# Patient Record
Sex: Female | Born: 1935 | Race: White | Hispanic: No | Marital: Married | Smoking: Former smoker
Health system: Southern US, Community
[De-identification: ages and names within clinical notes are randomized; demographics above are authoritative.]

## PROBLEM LIST (undated history)

## (undated) DIAGNOSIS — IMO0002 Reserved for concepts with insufficient information to code with codable children: Secondary | ICD-10-CM

## (undated) DIAGNOSIS — F0390 Unspecified dementia without behavioral disturbance: Secondary | ICD-10-CM

## (undated) DIAGNOSIS — R209 Unspecified disturbances of skin sensation: Secondary | ICD-10-CM

## (undated) DIAGNOSIS — M545 Low back pain, unspecified: Secondary | ICD-10-CM

## (undated) DIAGNOSIS — G20 Parkinson's disease: Secondary | ICD-10-CM

## (undated) DIAGNOSIS — E559 Vitamin D deficiency, unspecified: Secondary | ICD-10-CM

## (undated) DIAGNOSIS — M069 Rheumatoid arthritis, unspecified: Secondary | ICD-10-CM

## (undated) HISTORY — DX: Unspecified dementia without behavioral disturbance: F03.90

## (undated) HISTORY — DX: Vitamin D deficiency, unspecified: E55.9

## (undated) HISTORY — DX: Rheumatoid arthritis, unspecified: M06.9

## (undated) HISTORY — DX: Unspecified disturbances of skin sensation: R20.9

## (undated) HISTORY — DX: Low back pain, unspecified: M54.50

## (undated) HISTORY — DX: Low back pain: M54.5

## (undated) HISTORY — DX: Parkinson's disease: G20

## (undated) HISTORY — DX: Reserved for concepts with insufficient information to code with codable children: IMO0002

---

## 2004-01-30 ENCOUNTER — Ambulatory Visit: Payer: Self-pay | Admitting: Unknown Physician Specialty

## 2004-02-09 ENCOUNTER — Ambulatory Visit: Payer: Self-pay | Admitting: Unknown Physician Specialty

## 2004-08-04 ENCOUNTER — Ambulatory Visit: Payer: Self-pay | Admitting: Unknown Physician Specialty

## 2004-08-10 ENCOUNTER — Ambulatory Visit: Payer: Self-pay | Admitting: Unknown Physician Specialty

## 2005-05-10 ENCOUNTER — Ambulatory Visit: Payer: Self-pay | Admitting: Unknown Physician Specialty

## 2005-05-12 ENCOUNTER — Ambulatory Visit: Payer: Self-pay | Admitting: Unknown Physician Specialty

## 2005-08-23 ENCOUNTER — Ambulatory Visit: Payer: Self-pay | Admitting: Unknown Physician Specialty

## 2005-09-15 ENCOUNTER — Ambulatory Visit: Payer: Self-pay | Admitting: Rheumatology

## 2006-04-18 HISTORY — PX: LUMBAR SPINE SURGERY: SHX701

## 2006-05-31 ENCOUNTER — Ambulatory Visit: Payer: Self-pay | Admitting: Internal Medicine

## 2006-07-03 ENCOUNTER — Ambulatory Visit: Payer: Self-pay | Admitting: Cardiology

## 2006-07-03 ENCOUNTER — Inpatient Hospital Stay (HOSPITAL_COMMUNITY): Admission: RE | Admit: 2006-07-03 | Discharge: 2006-07-06 | Payer: Self-pay | Admitting: Neurological Surgery

## 2006-12-08 ENCOUNTER — Encounter: Payer: Self-pay | Admitting: Internal Medicine

## 2006-12-18 ENCOUNTER — Encounter: Payer: Self-pay | Admitting: Internal Medicine

## 2007-01-17 ENCOUNTER — Encounter: Payer: Self-pay | Admitting: Internal Medicine

## 2007-03-09 ENCOUNTER — Ambulatory Visit: Payer: Self-pay | Admitting: Internal Medicine

## 2007-08-02 ENCOUNTER — Ambulatory Visit: Payer: Self-pay | Admitting: Internal Medicine

## 2007-08-19 ENCOUNTER — Other Ambulatory Visit: Payer: Self-pay

## 2007-08-19 ENCOUNTER — Emergency Department: Payer: Self-pay | Admitting: Emergency Medicine

## 2008-09-09 ENCOUNTER — Ambulatory Visit: Payer: Self-pay | Admitting: Internal Medicine

## 2008-10-14 ENCOUNTER — Ambulatory Visit: Payer: Self-pay | Admitting: Internal Medicine

## 2009-04-16 ENCOUNTER — Encounter: Admission: RE | Admit: 2009-04-16 | Discharge: 2009-04-16 | Payer: Self-pay | Admitting: Neurological Surgery

## 2009-04-18 HISTORY — PX: VERTEBROPLASTY: SHX113

## 2009-04-29 ENCOUNTER — Encounter: Admission: RE | Admit: 2009-04-29 | Discharge: 2009-04-29 | Payer: Self-pay | Admitting: Neurological Surgery

## 2009-05-05 ENCOUNTER — Encounter: Admission: RE | Admit: 2009-05-05 | Discharge: 2009-05-05 | Payer: Self-pay | Admitting: Neurological Surgery

## 2009-05-18 ENCOUNTER — Encounter: Admission: RE | Admit: 2009-05-18 | Discharge: 2009-05-18 | Payer: Self-pay | Admitting: Neurological Surgery

## 2009-12-07 ENCOUNTER — Ambulatory Visit: Payer: Self-pay | Admitting: Internal Medicine

## 2010-09-03 NOTE — Op Note (Signed)
Kelly Brady, Kelly Brady            ACCOUNT NO.:  [id number]   MEDICAL RECORD NO.:  [id number]          PATIENT TYPE:  INP   LOCATION:  2899                         FACILITY:  [facility]   PHYSICIAN:  Stefani Dama, M.D.  DATE OF BIRTH:  1930-09-05   DATE OF PROCEDURE:  07/03/2006  DATE OF DISCHARGE:                               OPERATIVE REPORT   PREOPERATIVE DIAGNOSIS:  Spondylolisthesis, L4-5, with lumbar stenosis,  lumbar radiculopathy.   POSTOPERATIVE DIAGNOSIS:  Spondylolisthesis, L4-5, with lumbar stenosis,  lumbar radiculopathy.   PROCEDURES:  1. Laminotomy, L4-5.  2. Decompression of L4 and L5 nerve roots.  Posterior lumbar interbody      fusion with PEEK spacers, local autograft and allograft.  3. Fixation L4-L5 with pedicle screws.   SURGEON:  Stefani Dama, M.D.   FIRST ASSISTANT:  Hilda Lias, M.D.   ANESTHESIA:  General endotracheal.   INDICATIONS:  Kelly Brady is a 77 year old individual who has had a  spondylolisthesis at the L4-5 level with a severe stenosis.  She has  bilateral lumbar radiculopathy in addition to lower extremity pain and  weakness.  She was advised regarding surgical decompression and  arthrodesis, having failed extensive efforts at previous conservative  management.   PROCEDURE:  The patient was brought to the operating room supine on the  stretcher.  After smooth induction of general tracheal anesthesia she  was turned prone and the back was prepped with alcohol and then DuraPrep  and draped in a sterile fashion.  A midline incision was created and  carried down to lumbar dorsal fascia, which was opened on either side of  midline to expose the spinous processes of L4 and L5.  L4 was identified  positively on a radiograph and then by dissecting in the subperiosteal  spaces, the facettes at L4-5 could be exposed at the region of the  transverse process.  A self-retaining retractor was placed in the wound  and the laminotomies were  then created by removing the complete inferior  lamina of L4 out to and including the entirety of the facette at L4-5.  The laminar arch was left attached to the pars via a portion of the  medial lamina and the dissection in this fashion decompressed the common  dural tube.  Thickened redundant yellow ligament was removed in this  area from around the common dural tube and the lateral gutter out to and  including the medial borders of the facettes with compression on the L4  nerve roots.  The superior articular processes of L5 were undercut to  allow decompression of the subarticular portion of the common dural tube  and the L5 nerve root takeoffs zone.  This compression was noted to  exist bilaterally and bilateral decompression was carried out in this  fashion using high-speed drill and the Kerrison punch to perform the  decompression.  Once the nerve roots were all mobilized, the common  dural tube could be retracted medially and this exposed a significant  disk protrusion with the disk being rolled forward and the  spondylolisthesis.  The disk space was incised with a 15 blade  and a  combination of Kerrison rongeurs was used to evacuate the disk space of  a significant quantity of disk material and to also remove the  endplates.  The dissection was carried out in the disk space completely  to then decorticate the bone on the endplates.  This was done with a  combination of rasps, curettes and rongeurs and a disk space cutter.  The spaces were then sized and it was felt that a 12 mm PEEK spacer  would fit nicely to allow for some distraction, which also allowed for  reduction of the spondylolisthesis.  Two standard PEEK PLIF interbody  spacers of 12 mm in height were then packed with a combination of local  autograft, Osteocel and allograft, and these were fitted into the  interspaces after cutting and decorticating the endplates.  The right  spacer was placed first, the left spacer  placed second.  Bone was placed  into the interspace also before placing the second spacer.  Once this  portion was completed, attention was then turned to fixation and pedicle  screw placement was then placed using fluoroscopic guidance to place  pedicle screws adequately in the L4 and the L5 pedicles.  These were  first sounded with a pedicle probe and then 6.5 x 45 mm pedicle screws  were drilled and tapped and placed into the pedicles of L4 and L5.  Precontoured 35 mm rods were then connected to allow this construct to  the placed solidly between L4 and L5.  The posterolateral gutters which  were previously decorticated were then packed with the remainder of the  bone graft.  Once this was secured, the wound was copiously irrigated  with antibiotic irrigating solution and a final check revealed good  decompression of the L4 and the L5 nerve roots.  The lumbar dorsal  fascia was then reapproximated with #1 Vicryl in interrupted fashion, 2-  0 Vicryl was used on the subcutaneous tissues, 3-0 Vicryl  subcuticularly, and a dry sterile dressing was applied to the skin.  The  patient tolerated the procedure well and was returned to the recovery  room in stable condition.  Blood loss was estimated at 500 mL, 1 unit of  Cell Saver blood was returned.      Stefani Dama, M.D.  Electronically Signed     HJE/MEDQ  D:  07/03/2006  T:  07/04/2006  Job:  [id number]

## 2010-09-03 NOTE — Consult Note (Signed)
[name], Kelly Brady            ACCOUNT NO.:  [id number]   MEDICAL RECORD NO.:  [id number]          PATIENT TYPE:  INP   LOCATION:  3041                         FACILITY:  [facility]   PHYSICIAN:  Jesse Sans. Wall, MD, FACCDATE OF BIRTH:  1930-12-26   DATE OF CONSULTATION:  07/04/2006  DATE OF DISCHARGE:                                 CONSULTATION   CARDIOLOGY CONSULTATION:  We were asked by Dr. Danielle Dess to evaluate Kelly Brady with a resting  tachycardia and a history of hypertension postoperatively.   HISTORY OF PRESENT ILLNESS:  She is a very pleasant 77 year old,  married, white female who is status post L4-L5 laminectomy and  decompression fixation for spondylolisthesis and lumbar stenosis with a  radiculopathy.   She has had a history of hypertension and says she always has an  elevated heart rate when she is in the hospital or anxious.   Her primary care physician is Beverely Risen who is managing her blood  pressure on lisinopril and HCTZ.  Her blood pressure has actually been  good here.   She has a history of chronic back pain, gastroesophageal reflux and a  question of Parkinson disease.   CURRENT MEDICATIONS:  Are:  1. Alphagan drops both eyes b.i.d.  2. Calcium and vitamin q.h.s.  3. HCTZ 12.5 daily.  4. Lisinopril 20 mg a day.  5. Mobic 7.5 b.i.d.  6. Requip 3 mg t.i.d.  7. Morphine sulfate.  8. PCA.  9. Omega-3 one gram q.48 hours.  10.Effexor 37.5 mg a day.  11.Labetalol p.r.n.   SOCIAL HISTORY:  She lives in [place] with her husband.  She has 2  children.  She is retired from [employer].  She does not smoke or  drink or use illicit drugs.   FAMILY HISTORY:  Significant for her mother dying at age 40 of a stroke  and she has 1 sibling who is elderly, in her 78s, with atrial fib;  otherwise, unremarkable.   REVIEW OF SYSTEMS:  Negative fevers, chills, sweats or adenopathy.  HEENT:  No recent nasal bleeds, hearing or visual loss or dental issues.  SKIN:  Unremarkable.  CARDIOPULMONARY:  See HPI.  She does not have any  history of chest pain, shortness of breath, dyspnea on exertion, PND or  orthopnea.  She has had a little bit of lower extremity edema.  GENITOURINARY:  No frequency, urgency, dysuria or hematuria.  NEURO/PSYCHOLOGICAL:  No mood disturbance, depression or anxiety.  She  does get a little tense when she is in the hospital.  MUSCULOSKELETAL:  See HPI.  GI:  No nausea, vomiting, diarrhea, bright red blood per  rectum or melena.  She does have chronic constipation.  ENDOCRINE:  No  polyuria, dipsia, heat and cold intolerance.   EXAMINATION:  VITAL SIGNS:  Her blood pressure is 123/64.  Her pulse is  currently 92 and she is in sinus rhythm and she is regular.  Her  respiratory rate is 16, unlabored.  Temp is 99.4.  O2 saturation is 97%  on 2 liters.  GENERAL APPEARANCE:  A very pleasant lady.  She is slightly  pale.  SKIN:  Warm and dry.  HEENT:  Normocephalic, atraumatic.  PERRLA.  Extraocular movements  intact.  Sclerae are clear.  NECK:  Supple.  There is no JVD.  Carotid upstrokes are equal  bilaterally without bruits.  There is no thyromegaly.  CARDIOVASCULAR:  Regular rate and rhythm.  Normal S1, S2 without murmur,  rub or gallop.  LUNGS:  Clear to auscultation and percussion.  ABDOMINAL EXAM:  Soft.  Good bowel sounds are present.  No tenderness.  SKIN:  Unremarkable.  GENITOURINARY/RECTAL:  Deferred.  EXTREMITIES:  No cyanosis, clubbing or edema.  Pulses were present,  bilaterally symmetrical and her exam is grossly intact, except some left  lower extremity weakness.   Chest x-ray, on March 13, showed no active disease and a normal sinus  heart.  She has mild thoracic osteopenia.  Electrocardiogram, on June 22, 2006 preop, showed a heart rate of 89 beats per minute.  Normal sinus  rhythm.  Poor R-wave progression anterior precordium.  No ST-segment  changes.   LABORATORY DATA:  Hemoglobin is 11.3.   Electrolytes were normal.   ASSESSMENT/RECOMMENDATION:  1. Sinus tachycardia secondary to surgery, pain and anxiety within the      hospital.  EKG shows poor R-wave progression across the anterior      precordium, which is probably of no clinical significance.  She is      totally asymptomatic from a cardiac perspective with a negative      history of coronary artery disease.  She does have a cardiac risk      factors of age and hypertension.  2. Hypertension.  Under good control and on a good medical program.   RECOMMENDATIONS:  No cardiac evaluation is necessary at this time.  The  patient was assured that things were quite stable.  Thank you for the  consultation.      Thomas C. Daleen Squibb, MD, [facility]  Electronically Signed     TCW/MEDQ  D:  07/04/2006  T:  07/04/2006  Job:  [id number]   cc:   Stefani Dama, M.D.  Beverely Risen, M.D.

## 2010-09-03 NOTE — Discharge Summary (Signed)
NAMEKATHERINNE, [name]            ACCOUNT NO.:  [id number]   MEDICAL RECORD NO.:  [id number]          PATIENT TYPE:  INP   LOCATION:  3041                         FACILITY:  [facility]   PHYSICIAN:  Stefani Dama, M.D.  DATE OF BIRTH:  04-Apr-1931   DATE OF ADMISSION:  07/03/2006  DATE OF DISCHARGE:  07/06/2006                               DISCHARGE SUMMARY   ADMITTING DIAGNOSIS:  Lumbar spondylosis and spondylolisthesis at L4-L5  with stenosis and lumbar radiculopathy.   DISCHARGE AND FINAL DIAGNOSES:  1. Lumbar spondylosis and spondylolisthesis, L4-L5, with stenosis and      lumbar radiculopathy.  2. Acute blood loss anemia.  3. Parkinsonism.  4. Acute postoperative confusion.   CONDITION ON DISCHARGE:  Improving.   HOSPITAL COURSE:  Kelly Brady is a 77 year old individual who has  had significant spondylosis and spondylolisthesis with stenosis.  She  has evidence of lumbar radiculopathy at L5 distribution.  She was taken  to the operating room on July 03, 2006 where she underwent surgical  decompression arthrodesis at L4-L5.  Postoperatively, the patient was  noted to have some acute blood loss anemia with her hemoglobin going  from 14 to 11.  She seemed to tolerate this well with extra fluid. She  also had a diagnosis of tachycardia.  This was evaluated by [facility]iology and felt to typical postoperative and was corrected only with  IV and p.o. fluids.  She developed some confusional state during the  postoperative period on the second and third night postoperatively.  Her  bowels moved on the third day postoperatively.  Her incision has been  clean and dry, and she has become ambulatory.  She is tolerating the  postoperative pain with the use of some oral Percocet, and she is given  a prescription for #60 of these without refills.  She has also been on  Flexeril 10 mg one b.i.d. as needed for muscle spasms.  She is given a  prescription for this with p.r.n.  refills.  She will be seen in the  office in approximately three weeks' time.   CONDITION ON DISCHARGE:  Improving.      Stefani Dama, M.D.  Electronically Signed     HJE/MEDQ  D:  07/06/2006  T:  07/06/2006  Job:  [id number]

## 2010-12-09 ENCOUNTER — Ambulatory Visit: Payer: Self-pay | Admitting: Internal Medicine

## 2011-04-19 HISTORY — PX: MELANOMA EXCISION: SHX5266

## 2011-04-19 HISTORY — PX: CATARACT EXTRACTION: SUR2

## 2011-06-07 DIAGNOSIS — R350 Frequency of micturition: Secondary | ICD-10-CM | POA: Diagnosis not present

## 2011-06-07 DIAGNOSIS — F411 Generalized anxiety disorder: Secondary | ICD-10-CM | POA: Diagnosis not present

## 2011-06-07 DIAGNOSIS — F5102 Adjustment insomnia: Secondary | ICD-10-CM | POA: Diagnosis not present

## 2011-06-07 DIAGNOSIS — F329 Major depressive disorder, single episode, unspecified: Secondary | ICD-10-CM | POA: Diagnosis not present

## 2011-06-07 DIAGNOSIS — I10 Essential (primary) hypertension: Secondary | ICD-10-CM | POA: Diagnosis not present

## 2011-06-07 DIAGNOSIS — R269 Unspecified abnormalities of gait and mobility: Secondary | ICD-10-CM | POA: Diagnosis not present

## 2011-06-20 DIAGNOSIS — G20 Parkinson's disease: Secondary | ICD-10-CM | POA: Diagnosis not present

## 2011-07-06 DIAGNOSIS — Z85828 Personal history of other malignant neoplasm of skin: Secondary | ICD-10-CM | POA: Diagnosis not present

## 2011-07-06 DIAGNOSIS — L570 Actinic keratosis: Secondary | ICD-10-CM | POA: Diagnosis not present

## 2011-07-06 DIAGNOSIS — D485 Neoplasm of uncertain behavior of skin: Secondary | ICD-10-CM | POA: Diagnosis not present

## 2011-07-06 DIAGNOSIS — C4492 Squamous cell carcinoma of skin, unspecified: Secondary | ICD-10-CM | POA: Diagnosis not present

## 2011-08-16 DIAGNOSIS — H2510 Age-related nuclear cataract, unspecified eye: Secondary | ICD-10-CM | POA: Diagnosis not present

## 2011-08-17 DIAGNOSIS — D0460 Carcinoma in situ of skin of unspecified upper limb, including shoulder: Secondary | ICD-10-CM | POA: Diagnosis not present

## 2011-08-17 DIAGNOSIS — C4492 Squamous cell carcinoma of skin, unspecified: Secondary | ICD-10-CM | POA: Diagnosis not present

## 2011-08-29 ENCOUNTER — Ambulatory Visit: Payer: Self-pay | Admitting: Ophthalmology

## 2011-08-29 DIAGNOSIS — Z01812 Encounter for preprocedural laboratory examination: Secondary | ICD-10-CM | POA: Diagnosis not present

## 2011-08-29 DIAGNOSIS — H2510 Age-related nuclear cataract, unspecified eye: Secondary | ICD-10-CM | POA: Diagnosis not present

## 2011-08-29 DIAGNOSIS — I10 Essential (primary) hypertension: Secondary | ICD-10-CM | POA: Diagnosis not present

## 2011-08-29 DIAGNOSIS — Z01810 Encounter for preprocedural cardiovascular examination: Secondary | ICD-10-CM | POA: Diagnosis not present

## 2011-08-29 LAB — POTASSIUM: Potassium: 4.2 mmol/L (ref 3.5–5.1)

## 2011-09-09 ENCOUNTER — Ambulatory Visit: Payer: Self-pay | Admitting: Ophthalmology

## 2011-09-09 DIAGNOSIS — E7800 Pure hypercholesterolemia, unspecified: Secondary | ICD-10-CM | POA: Diagnosis not present

## 2011-09-09 DIAGNOSIS — Z85828 Personal history of other malignant neoplasm of skin: Secondary | ICD-10-CM | POA: Diagnosis not present

## 2011-09-09 DIAGNOSIS — H2510 Age-related nuclear cataract, unspecified eye: Secondary | ICD-10-CM | POA: Diagnosis not present

## 2011-09-09 DIAGNOSIS — M4800 Spinal stenosis, site unspecified: Secondary | ICD-10-CM | POA: Diagnosis not present

## 2011-09-09 DIAGNOSIS — R0601 Orthopnea: Secondary | ICD-10-CM | POA: Diagnosis not present

## 2011-09-09 DIAGNOSIS — I10 Essential (primary) hypertension: Secondary | ICD-10-CM | POA: Diagnosis not present

## 2011-09-09 DIAGNOSIS — H269 Unspecified cataract: Secondary | ICD-10-CM | POA: Diagnosis not present

## 2011-09-09 DIAGNOSIS — G20 Parkinson's disease: Secondary | ICD-10-CM | POA: Diagnosis not present

## 2011-09-09 DIAGNOSIS — Z79899 Other long term (current) drug therapy: Secondary | ICD-10-CM | POA: Diagnosis not present

## 2011-09-09 DIAGNOSIS — R0609 Other forms of dyspnea: Secondary | ICD-10-CM | POA: Diagnosis not present

## 2011-09-09 DIAGNOSIS — K219 Gastro-esophageal reflux disease without esophagitis: Secondary | ICD-10-CM | POA: Diagnosis not present

## 2011-10-03 DIAGNOSIS — M79609 Pain in unspecified limb: Secondary | ICD-10-CM | POA: Diagnosis not present

## 2011-10-03 DIAGNOSIS — M545 Low back pain: Secondary | ICD-10-CM | POA: Diagnosis not present

## 2011-10-05 DIAGNOSIS — Z85828 Personal history of other malignant neoplasm of skin: Secondary | ICD-10-CM | POA: Diagnosis not present

## 2011-10-27 DIAGNOSIS — I10 Essential (primary) hypertension: Secondary | ICD-10-CM | POA: Diagnosis not present

## 2011-10-27 DIAGNOSIS — E782 Mixed hyperlipidemia: Secondary | ICD-10-CM | POA: Diagnosis not present

## 2011-10-27 DIAGNOSIS — Z0000 Encounter for general adult medical examination without abnormal findings: Secondary | ICD-10-CM | POA: Diagnosis not present

## 2011-10-27 DIAGNOSIS — F5102 Adjustment insomnia: Secondary | ICD-10-CM | POA: Diagnosis not present

## 2011-10-27 DIAGNOSIS — R300 Dysuria: Secondary | ICD-10-CM | POA: Diagnosis not present

## 2011-10-27 DIAGNOSIS — K219 Gastro-esophageal reflux disease without esophagitis: Secondary | ICD-10-CM | POA: Diagnosis not present

## 2011-11-14 DIAGNOSIS — IMO0002 Reserved for concepts with insufficient information to code with codable children: Secondary | ICD-10-CM | POA: Diagnosis not present

## 2011-11-14 DIAGNOSIS — G20 Parkinson's disease: Secondary | ICD-10-CM | POA: Diagnosis not present

## 2011-11-14 DIAGNOSIS — M545 Low back pain: Secondary | ICD-10-CM | POA: Diagnosis not present

## 2011-12-12 ENCOUNTER — Ambulatory Visit: Payer: Self-pay | Admitting: Internal Medicine

## 2011-12-12 DIAGNOSIS — Z1231 Encounter for screening mammogram for malignant neoplasm of breast: Secondary | ICD-10-CM | POA: Diagnosis not present

## 2011-12-20 DIAGNOSIS — L821 Other seborrheic keratosis: Secondary | ICD-10-CM | POA: Diagnosis not present

## 2011-12-20 DIAGNOSIS — Z85828 Personal history of other malignant neoplasm of skin: Secondary | ICD-10-CM | POA: Diagnosis not present

## 2012-01-09 DIAGNOSIS — Z23 Encounter for immunization: Secondary | ICD-10-CM | POA: Diagnosis not present

## 2012-02-14 DIAGNOSIS — I10 Essential (primary) hypertension: Secondary | ICD-10-CM | POA: Diagnosis not present

## 2012-02-14 DIAGNOSIS — H40009 Preglaucoma, unspecified, unspecified eye: Secondary | ICD-10-CM | POA: Diagnosis not present

## 2012-02-14 DIAGNOSIS — E039 Hypothyroidism, unspecified: Secondary | ICD-10-CM | POA: Diagnosis not present

## 2012-02-14 DIAGNOSIS — Z79899 Other long term (current) drug therapy: Secondary | ICD-10-CM | POA: Diagnosis not present

## 2012-02-14 DIAGNOSIS — E782 Mixed hyperlipidemia: Secondary | ICD-10-CM | POA: Diagnosis not present

## 2012-02-27 DIAGNOSIS — G250 Essential tremor: Secondary | ICD-10-CM | POA: Diagnosis not present

## 2012-02-27 DIAGNOSIS — G252 Other specified forms of tremor: Secondary | ICD-10-CM | POA: Diagnosis not present

## 2012-02-27 DIAGNOSIS — F411 Generalized anxiety disorder: Secondary | ICD-10-CM | POA: Diagnosis not present

## 2012-02-27 DIAGNOSIS — R269 Unspecified abnormalities of gait and mobility: Secondary | ICD-10-CM | POA: Diagnosis not present

## 2012-02-27 DIAGNOSIS — I10 Essential (primary) hypertension: Secondary | ICD-10-CM | POA: Diagnosis not present

## 2012-02-27 DIAGNOSIS — R635 Abnormal weight gain: Secondary | ICD-10-CM | POA: Diagnosis not present

## 2012-05-14 DIAGNOSIS — R209 Unspecified disturbances of skin sensation: Secondary | ICD-10-CM | POA: Diagnosis not present

## 2012-05-14 DIAGNOSIS — G20 Parkinson's disease: Secondary | ICD-10-CM | POA: Diagnosis not present

## 2012-06-05 DIAGNOSIS — L570 Actinic keratosis: Secondary | ICD-10-CM | POA: Diagnosis not present

## 2012-06-26 DIAGNOSIS — G20 Parkinson's disease: Secondary | ICD-10-CM | POA: Diagnosis not present

## 2012-06-26 DIAGNOSIS — E782 Mixed hyperlipidemia: Secondary | ICD-10-CM | POA: Diagnosis not present

## 2012-06-26 DIAGNOSIS — K219 Gastro-esophageal reflux disease without esophagitis: Secondary | ICD-10-CM | POA: Diagnosis not present

## 2012-06-26 DIAGNOSIS — I10 Essential (primary) hypertension: Secondary | ICD-10-CM | POA: Diagnosis not present

## 2012-06-26 DIAGNOSIS — G250 Essential tremor: Secondary | ICD-10-CM | POA: Diagnosis not present

## 2012-06-27 DIAGNOSIS — R635 Abnormal weight gain: Secondary | ICD-10-CM | POA: Diagnosis not present

## 2012-06-27 DIAGNOSIS — Z006 Encounter for examination for normal comparison and control in clinical research program: Secondary | ICD-10-CM | POA: Diagnosis not present

## 2012-07-09 DIAGNOSIS — F5102 Adjustment insomnia: Secondary | ICD-10-CM | POA: Diagnosis not present

## 2012-07-09 DIAGNOSIS — F411 Generalized anxiety disorder: Secondary | ICD-10-CM | POA: Diagnosis not present

## 2012-07-09 DIAGNOSIS — I10 Essential (primary) hypertension: Secondary | ICD-10-CM | POA: Diagnosis not present

## 2012-07-09 DIAGNOSIS — E782 Mixed hyperlipidemia: Secondary | ICD-10-CM | POA: Diagnosis not present

## 2012-07-09 DIAGNOSIS — G252 Other specified forms of tremor: Secondary | ICD-10-CM | POA: Diagnosis not present

## 2012-07-09 DIAGNOSIS — G250 Essential tremor: Secondary | ICD-10-CM | POA: Diagnosis not present

## 2012-07-17 DIAGNOSIS — F5102 Adjustment insomnia: Secondary | ICD-10-CM | POA: Diagnosis not present

## 2012-07-17 DIAGNOSIS — E782 Mixed hyperlipidemia: Secondary | ICD-10-CM | POA: Diagnosis not present

## 2012-07-17 DIAGNOSIS — F411 Generalized anxiety disorder: Secondary | ICD-10-CM | POA: Diagnosis not present

## 2012-07-17 DIAGNOSIS — G250 Essential tremor: Secondary | ICD-10-CM | POA: Diagnosis not present

## 2012-07-17 DIAGNOSIS — R279 Unspecified lack of coordination: Secondary | ICD-10-CM | POA: Diagnosis not present

## 2012-07-17 DIAGNOSIS — G252 Other specified forms of tremor: Secondary | ICD-10-CM | POA: Diagnosis not present

## 2012-07-17 DIAGNOSIS — I10 Essential (primary) hypertension: Secondary | ICD-10-CM | POA: Diagnosis not present

## 2012-07-17 DIAGNOSIS — R269 Unspecified abnormalities of gait and mobility: Secondary | ICD-10-CM | POA: Diagnosis not present

## 2012-07-27 DIAGNOSIS — I059 Rheumatic mitral valve disease, unspecified: Secondary | ICD-10-CM | POA: Diagnosis not present

## 2012-08-10 DIAGNOSIS — I6529 Occlusion and stenosis of unspecified carotid artery: Secondary | ICD-10-CM | POA: Diagnosis not present

## 2012-08-16 DIAGNOSIS — I10 Essential (primary) hypertension: Secondary | ICD-10-CM | POA: Diagnosis not present

## 2012-08-16 DIAGNOSIS — R279 Unspecified lack of coordination: Secondary | ICD-10-CM | POA: Diagnosis not present

## 2012-08-16 DIAGNOSIS — R269 Unspecified abnormalities of gait and mobility: Secondary | ICD-10-CM | POA: Diagnosis not present

## 2012-08-16 DIAGNOSIS — E782 Mixed hyperlipidemia: Secondary | ICD-10-CM | POA: Diagnosis not present

## 2012-08-16 DIAGNOSIS — G252 Other specified forms of tremor: Secondary | ICD-10-CM | POA: Diagnosis not present

## 2012-08-16 DIAGNOSIS — G250 Essential tremor: Secondary | ICD-10-CM | POA: Diagnosis not present

## 2012-08-16 DIAGNOSIS — F5102 Adjustment insomnia: Secondary | ICD-10-CM | POA: Diagnosis not present

## 2012-08-16 DIAGNOSIS — F411 Generalized anxiety disorder: Secondary | ICD-10-CM | POA: Diagnosis not present

## 2012-09-11 ENCOUNTER — Ambulatory Visit (INDEPENDENT_AMBULATORY_CARE_PROVIDER_SITE_OTHER): Payer: Medicare Other | Admitting: Neurology

## 2012-09-11 ENCOUNTER — Other Ambulatory Visit: Payer: Self-pay | Admitting: *Deleted

## 2012-09-11 ENCOUNTER — Encounter: Payer: Self-pay | Admitting: Neurology

## 2012-09-11 VITALS — BP 132/80 | HR 68 | Temp 97.0°F | Ht 61.0 in | Wt 163.0 lb

## 2012-09-11 DIAGNOSIS — G20 Parkinson's disease: Secondary | ICD-10-CM | POA: Diagnosis not present

## 2012-09-11 NOTE — Progress Notes (Signed)
Subjective:    Kelly Brady [name] Kelly Brady Brady is a 79 y.o. female.  HPI  Interim history:   Kelly Brady Kelly Brady Brady is a very pleasant 79 year old right-handed woman who presents for followup consultation of her parkinsonism, associated with dyskinesias and hallucinations as well as some memory loss. She is accompanied by her husband today. This is her first visit with me and she previously followed with Kelly Brady Kelly Brady Brady who was last seen by him on 05/14/2012 at which time Kelly Brady Kelly Brady Brady reduced her carbidopa-levodopa because of her hallucinations and dyskinesias and decreased her pramipexole to 0.25 mg at night only. She has an underlying medical history of migraines, anxiety, hypertension, arthritis, depression and hyperlipidemia. She is currently on amitriptyline 10 mg every night, simvastatin, meloxicam, pramipexole 0.25 mg at night, Sinemet CR 50/200 mg strength one and half tablets in Kelly Brady morning, one at noon and one at 4 PM, Bisoprolol, alprazolam, Lexapro, lansoprazole, furosemide, alendronate, vesicular, vitamin D. She has been taking C/L 50-200 qid and mirapex 0.25 mg bid now, per PCP. Her husband reports that she is rather sleepy during Kelly Brady day. She kicks in her sleep. She fell a few months ago in Kelly Brady bathroom without head injury or LOC. She has a cane and a walker but typically does not use them.   I reviewed Kelly Brady Kelly Brady Brady prior notes and Kelly Brady Kelly Brady Brady's records and below is a summary of that review:   79 year old right-handed woman followed previously by Kelly Brady Kelly Brady Brady in [place] and Dr. Dennie Kelly Brady Brady of Kelly Brady last several years for parkinsonism characterized by difficulty walking, poor balance, with Kelly Brady absence of tremor. She has been tried on Requip generic and carbidopa-levodopa. She was seen by Kelly Brady Kelly Brady Brady at Kelly Brady [facility] in 2009 who did not feel that she had Parkinson's disease and tapered her off her medications. She did become worse. And was restarted on medication by Kelly Brady Kelly Brady Brady in 2010. She had vertebroplasty do to vertebral  fractures. She also had lumbar spine fusion and March 2008. She was seen by Kelly Brady Kelly Brady Brady in February 2011 who felt that she had parkinsonism. She has a history of hallucinations and there was concern for loose body dementia. She also has symptoms suggestive of REM sleep disorder. She has had recurrent falls. MRI of Kelly Brady brain with and without contrast in May 2007 showed subcortical and deep white matter chronic ischemic changes. MRI C-spine and May 2007 showed mild degenerative changes. MRI L-spine in May 2007 showed spondylolisthesis. Sensory evoked potentials, TSH, ACE level and ANA as well as ESR and vitamin B12 were normal in July 2011. Her vitamin D was low. Rheumatoid factor was increased. Anti-CCP was negative. She developed dyskinesias. She developed memory problems. In January 2014 her MMSE was 26, clock drawing was 4, animal fluency was 15. Her falls assessment tool score was 20.  Her Past Medical History Is Significant For: Past Medical History  Diagnosis Date  . Closed fracture of unspecified part of vertebral column without mention of spinal cord injury   . Lumbago   . Rheumatoid arthritis   . Unspecified vitamin D deficiency   . Disturbance of skin sensation   . Paralysis agitans     Her Past Surgical History Is Significant For: Past Surgical History  Procedure Laterality Date  . Lumbar spine surgery  2008  . Cataract extraction Right 2013  . Vertebroplasty  2011  . Melanoma excision Left 2013    arm    Her Family History Is Significant For: Family History  Problem Relation Age of Onset  .  Kidney failure Mother   . Stroke Father     Her Social History Is Significant For: History   Social History  . Marital Status: Married    Spouse Name: N/A    Number of Children: N/A  . Years of Education: N/A   Social History Main Topics  . Smoking status: Former Smoker    Types: Cigarettes    Quit date: 09/12/1962  . Smokeless tobacco: None  . Alcohol Use: No  . Drug Use: No   . Sexually Active: None   Other Topics Concern  . None   Social History Narrative  . None    Her Allergies Are:  Allergies not on file:   Her Current Medications Are:  Outpatient Encounter Prescriptions as of 09/11/2012  Medication Sig Dispense Refill  . ALPRAZolam (XANAX) 0.25 MG tablet       . amitriptyline (ELAVIL) 10 MG tablet       . bisoprolol-hydrochlorothiazide (ZIAC) 5-6.25 MG per tablet       . carbidopa-levodopa (SINEMET CR) 50-200 MG per tablet       . escitalopram (LEXAPRO) 10 MG tablet       . lansoprazole (PREVACID) 30 MG capsule       . pramipexole (MIRAPEX) 0.25 MG tablet       . simvastatin (ZOCOR) 40 MG tablet       . butalbital-acetaminophen-caffeine (FIORICET, ESGIC) 50-325-40 MG per tablet       . donepezil (ARICEPT) 5 MG tablet       . meloxicam (MOBIC) 15 MG tablet       . traMADol (ULTRAM) 50 MG tablet        No facility-administered encounter medications on file as of 09/11/2012.   Review of Systems  Constitutional: Positive for unexpected weight change (gain).  HENT: Positive for hearing loss.   Respiratory: Positive for shortness of breath.   Hematological: Bruises/bleeds easily.  Psychiatric/Behavioral: Positive for confusion and dysphoric mood. Kelly Brady Kelly Brady Brady is nervous/anxious.        Too much sleep    Objective:  Neurologic Exam  Physical Exam Physical Examination:   Filed Vitals:   09/11/12 1118  BP: 132/80  Pulse: 68  Temp: 97 F (36.1 C)    General Examination: Kelly Brady Kelly Brady Brady is a very pleasant 79 y.o. female in no acute distress.  HEENT: Normocephalic, atraumatic, pupils are equal, round and reactive to light and accommodation. Funduscopic exam is normal with sharp disc margins noted. Extraocular tracking shows mild saccadic breakdown without nystagmus noted. There is limitation to upper gaze. There is no significant decrease in eye blink rate. Hearing is intact. Tympanic membranes are clear bilaterally. Face is symmetric with very  minimal facial masking and normal facial sensation. There is no lip, neck or jaw tremor. Neck is very mildly rigid with intact passive ROM. There are no carotid bruits on auscultation. Oropharynx exam reveals mild mouth dryness. No significant airway crowding is noted. Mallampati is class II. Tongue protrudes centrally and palate elevates symmetrically.   There is no drooling.   Chest: is clear to auscultation without wheezing, rhonchi or crackles noted.  Heart: sounds are regular and normal without murmurs, rubs or gallops noted.   Abdomen: is soft, non-tender and non-distended with normal bowel sounds appreciated on auscultation.  Extremities: There is no pitting edema in Kelly Brady distal lower extremities bilaterally.   Skin: is warm and dry with no trophic changes noted. Age-related changes are noted on Kelly Brady skin.   Musculoskeletal: exam reveals no  obvious joint deformities, tenderness, joint swelling or erythema.  Neurologically:  Mental status: Kelly Brady Kelly Brady Brady is awake and alert, paying good  attention. She is able to partially provide Kelly Brady history. Her husband provides details. She is oriented to: person, situation, month of year and year. Her memory, attention, language and knowledge are impaired mildly. There is no aphasia, agnosia, apraxia or anomia. There is a mild degree of bradyphrenia. Speech is mildly hypophonic with no dysarthria noted. Mood is congruent and affect is normal.   Cranial nerves are as described above under HEENT exam. In addition, shoulder shrug is normal with equal shoulder height noted.  Motor exam: Normal bulk, and strength for age is noted. There are no dyskinesias noted. Tone is very mildly rigid with absence of cogwheeling. There is overall mild bradykinesia. There is no drift or rebound.  There is no tremor. Romberg is not tested.   Reflexes are 1+ in Kelly Brady upper extremities and 1+ in Kelly Brady lower extremities.  Fine motor skills exam: Finger taps are mildly impaired on Kelly Brady  right and mildly impaired on Kelly Brady left. Hand movements are mildly impaired on Kelly Brady right and mildly impaired on Kelly Brady left. RAP (rapid alternating patting) is mildly impaired on Kelly Brady right and mildly impaired on Kelly Brady left. Foot taps are mildly impaired on Kelly Brady right and mildly impaired on Kelly Brady left. Foot agility (in Kelly Brady form of heel stomping) is mildly impaired on Kelly Brady right and mildly impaired on Kelly Brady left.    Cerebellar testing shows no dysmetria or intention tremor on finger to nose testing. Heel to shin is unremarkable bilaterally. There is no truncal or gait ataxia.   Sensory exam is intact to light touch, pinprick, vibration, temperature sense and proprioception in Kelly Brady upper and lower extremities.   Gait, station and balance: She stands up from Kelly Brady seated position with mild difficulty and does not need to push up with Her hands. She needs no assistance. No veering to one side is noted. She is not noted to lean to Kelly Brady side. Posture is moderately stooped, but could be age-appropriate. Stance is wide-based. She walks with decrease in stride length and pace and fairly preserved arm swing. She turns in 3 steps. Tandem walk is not possible. Balance is mildly impaired. She is not able to do a toe or heel stance.      Assessment and Plan:   In summary, Kelly Brady Kelly Brady Brady is a very pleasant 79 y.o.-year old female with a history of parkinsonism. She has overall fairly mild findings and no significant lateralized Kelly Brady Brady today. I am not convinced that she has idiopathic Parkinson's disease. Given Kelly Brady degree of daytime sleepiness reported by her husband I would like to scale back on her medications. I therefore suggested carbidopa-levodopa CR 50-200 mg strength one tablet 3 times a day, namely at 8 AM, 2 PM and 8 PM. I would like for her to take her pramipexole 0.25 mg only at bedtime. She is advised to use her cane or her walker for safety at all times. She is also advised to try to walk regularly with an obvious  physical limitations. She is advised to maintain a healthy lifestyle in general and we talked about keeping a scheduled wake time and bedtime and keep well hydrated and exercising daily and eating healthy.  I answered all their questions today and Kelly Brady Kelly Brady Brady and her husband were in agreement with Kelly Brady above outlined plan. I would like to see Kelly Brady Kelly Brady Brady back in 4 months, sooner if Kelly Brady need  arises and encouraged them to call with any interim questions, concerns, problems or updates.

## 2012-09-11 NOTE — Patient Instructions (Addendum)
I think overall you are doing fairly well but I do want to suggest a few things today:  Remember to drink plenty of fluid, eat healthy meals and do not skip any meals. Try to eat protein with a every meal and eat a healthy snack such as fruit or nuts in between meals. Try to keep a regular sleep-wake schedule and try to exercise daily, particularly in the form of walking, 20-30 minutes a day, if you can.   Engage in social activities in your community and with your family and try to keep up with current events by reading the newspaper or watching the news.   As far as your medications are concerned, I would like to suggest: take carbidopa/levodopa 1 pill at: 8 AM, 2 PM and 8 PM and take pramipexole 0.25 mg one pill only at bedtime.    As far as diagnostic testing: no new test.   I would like to see you back in 4 months, sooner if we need to. Please call us with any interim questions, concerns, problems, updates or refill requests.  Brett Canales is my clinical assistant and will answer any of your questions and relay your messages to me and also relay most of my messages to you.  Our phone number is [phone]. We also have an after hours call service for urgent matters and there is a physician on-call for urgent questions. For any emergencies you know to call 911 or go to the nearest emergency room.

## 2012-12-03 DIAGNOSIS — D235 Other benign neoplasm of skin of trunk: Secondary | ICD-10-CM | POA: Diagnosis not present

## 2012-12-03 DIAGNOSIS — Z85828 Personal history of other malignant neoplasm of skin: Secondary | ICD-10-CM | POA: Diagnosis not present

## 2012-12-03 DIAGNOSIS — L820 Inflamed seborrheic keratosis: Secondary | ICD-10-CM | POA: Diagnosis not present

## 2012-12-18 DIAGNOSIS — R5381 Other malaise: Secondary | ICD-10-CM | POA: Diagnosis not present

## 2012-12-18 DIAGNOSIS — K219 Gastro-esophageal reflux disease without esophagitis: Secondary | ICD-10-CM | POA: Diagnosis not present

## 2012-12-18 DIAGNOSIS — E782 Mixed hyperlipidemia: Secondary | ICD-10-CM | POA: Diagnosis not present

## 2012-12-18 DIAGNOSIS — M159 Polyosteoarthritis, unspecified: Secondary | ICD-10-CM | POA: Diagnosis not present

## 2012-12-18 DIAGNOSIS — I6529 Occlusion and stenosis of unspecified carotid artery: Secondary | ICD-10-CM | POA: Diagnosis not present

## 2012-12-18 DIAGNOSIS — I10 Essential (primary) hypertension: Secondary | ICD-10-CM | POA: Diagnosis not present

## 2013-01-08 DIAGNOSIS — Z23 Encounter for immunization: Secondary | ICD-10-CM | POA: Diagnosis not present

## 2013-01-11 ENCOUNTER — Encounter: Payer: Self-pay | Admitting: Neurology

## 2013-01-11 ENCOUNTER — Ambulatory Visit (INDEPENDENT_AMBULATORY_CARE_PROVIDER_SITE_OTHER): Payer: Medicare Other | Admitting: Neurology

## 2013-01-11 VITALS — BP 123/76 | HR 72 | Temp 97.2°F | Ht 61.0 in | Wt 158.0 lb

## 2013-01-11 DIAGNOSIS — R269 Unspecified abnormalities of gait and mobility: Secondary | ICD-10-CM

## 2013-01-11 DIAGNOSIS — F0390 Unspecified dementia without behavioral disturbance: Secondary | ICD-10-CM | POA: Diagnosis not present

## 2013-01-11 DIAGNOSIS — G20 Parkinson's disease: Secondary | ICD-10-CM | POA: Diagnosis not present

## 2013-01-11 HISTORY — DX: Unspecified dementia, unspecified severity, without behavioral disturbance, psychotic disturbance, mood disturbance, and anxiety: F03.90

## 2013-01-11 MED ORDER — RIVASTIGMINE 4.6 MG/24HR TD PT24: MEDICATED_PATCH | TRANSDERMAL | Status: DC

## 2013-01-11 NOTE — Patient Instructions (Signed)
Your memory has become worse and I would like to start you on a new medication called Exelon. This is a patchy have to change every day. I have given you one week worth of samples to try and then a free 30 day trial with prescription and coupon. Call us in about 3-4 weeks to report how you are tolerating this medication. At the time I will probably call in a prescription for the 9.5 mg strength next and I will see you back in about 3-4 months for followup to recheck of your memory and gait disorder. We will not make any changes to your Parkinson's medication. I want you to use your cane more consistently as you don't walk very securely.

## 2013-01-11 NOTE — Progress Notes (Signed)
Subjective:    Patient ID: Kelly Brady is a 79 y.o. female.  HPI  Interim history:   Kelly Brady is a very pleasant 79 year old right-handed woman who presents for followup consultation of her parkinsonism, associated with dyskinesias and hallucinations as well as some memory loss. She is accompanied by her husband again today. I first met her on 09/11/12, at which time she was reported significant daytime somnolence and I asked her to cut back on her medications because of her sleepiness and her dyskinesias. I suggested carbidopa-levodopa CR 50-200 mg strength one tablet 3 times a day, namely at 8 AM, 2 PM and 8 PM and pramipexole 0.25 mg only at bedtime, but she is till on the bid.  She was advised to use her cane or her walker for safety at all times.  She previously followed with Dr. Avie Echevaria who was last seen by him on 05/14/2012, at which time Dr. Sandria Manly reduced her carbidopa-levodopa because of her hallucinations and dyskinesias. She has an underlying medical history of migraines, anxiety, hypertension, arthritis, depression and hyperlipidemia. She is currently on amitriptyline 10 mg every night, simvastatin, meloxicam, pramipexole 0.25 mg afternoon and bedtime, Sinemet CR 50/200 mg strength one tablet in the morning, one at noon and one at 4 PM, Bisoprolol, Lexapro, lansoprazole, vesicare and Calcium. Her husband reported that she was rather sleepy during the day. She kicks in her sleep. She fell a few months ago in the bathroom without head injury or LOC. Prior to seeing Dr. Sandria Manly, she was followed by Dr. Tresa Endo in [place] and Dr. Dennie Fetters with a history of difficulty walking, poor balance, with the absence of tremor. She has been tried on Requip generic and carbidopa-levodopa. She was seen by Dr. Laurence Aly at [facility] in 2009 who did not feel that she had Parkinson's disease and tapered her off her medications. She become worse and was restarted on medication by Dr. Welton Flakes in 2010. She had  vertebroplasty d/t  vertebral fractures. She also had lumbar spine fusion and March 2008. She was seen by Dr. love in February 2011 who felt that she had parkinsonism. She has a history of hallucinations and there was concern for loose body dementia. She also has symptoms suggestive of REM sleep disorder. She has had recurrent falls. MRI of the brain with and without contrast in May 2007 showed subcortical and deep white matter chronic ischemic changes. MRI C-spine and May 2007 showed mild degenerative changes. MRI L-spine in May 2007 showed spondylolisthesis. Sensory evoked potentials, TSH, ACE level and ANA as well as ESR and vitamin B12 were normal in July 2011. Her vitamin D was low. Rheumatoid factor was increased. Anti-CCP was negative. She developed dyskinesias. She developed memory problems. In January 2014 her MMSE was 26, clock drawing was 4, animal fluency was 15. Her falls assessment tool score was 20. She reports no new issues, her sleepiness is better per husband, her dyskinesias are stable.  She has had more forgetfulness for recent memory. She has not driven in 2 years. She does not exercise. She had VH more in the past, then they improved and last night she had VH and she has had some AH. She had hallucinations with Aricept.   Her Past Medical History Is Significant For: Past Medical History  Diagnosis Date  . Closed fracture of unspecified part of vertebral column without mention of spinal cord injury   . Lumbago   . Rheumatoid arthritis(714.0)   . Unspecified vitamin D deficiency   .  Disturbance of skin sensation   . Paralysis agitans     Her Past Surgical History Is Significant For: Past Surgical History  Procedure Laterality Date  . Lumbar spine surgery  2008  . Cataract extraction Right 2013  . Vertebroplasty  2011  . Melanoma excision Left 2013    arm    Her Family History Is Significant For: Family History  Problem Relation Age of Onset  . Kidney failure Mother    . Stroke Father     Her Social History Is Significant For: History   Social History  . Marital Status: Married    Spouse Name: N/A    Number of Children: N/A  . Years of Education: N/A   Social History Main Topics  . Smoking status: Former Smoker    Types: Cigarettes    Quit date: 09/12/1962  . Smokeless tobacco: None  . Alcohol Use: No  . Drug Use: No  . Sexual Activity: None   Other Topics Concern  . None   Social History Narrative  . None    Her Allergies Are:  Not on File:   Her Current Medications Are:  Outpatient Encounter Prescriptions as of 01/11/2013  Medication Sig Dispense Refill  . amitriptyline (ELAVIL) 10 MG tablet Take 10 mg by mouth at bedtime.      . bisoprolol-hydrochlorothiazide (ZIAC) 5-6.25 MG per tablet Take 0.5 tablets by mouth daily.      Marland Kitchen CALCIUM PO Take 1 tablet by mouth daily.      . carbidopa-levodopa (SINEMET CR) 50-200 MG per tablet Take 1 tablet by mouth 2 (two) times daily.      Marland Kitchen escitalopram (LEXAPRO) 10 MG tablet Take 10 mg by mouth daily.      . lansoprazole (PREVACID) 30 MG capsule Take 30 mg by mouth daily.      . pramipexole (MIRAPEX) 0.25 MG tablet Take 0.25 mg by mouth 2 (two) times daily.      . simvastatin (ZOCOR) 40 MG tablet Take 40 mg by mouth every evening.      . solifenacin (VESICARE) 10 MG tablet Take 10 mg by mouth daily.      . [DISCONTINUED] ALPRAZolam (XANAX) 0.25 MG tablet       . [DISCONTINUED] amitriptyline (ELAVIL) 10 MG tablet       . [DISCONTINUED] bisoprolol-hydrochlorothiazide (ZIAC) 5-6.25 MG per tablet       . [DISCONTINUED] butalbital-acetaminophen-caffeine (FIORICET, ESGIC) 50-325-40 MG per tablet       . [DISCONTINUED] carbidopa-levodopa (SINEMET CR) 50-200 MG per tablet       . [DISCONTINUED] donepezil (ARICEPT) 5 MG tablet       . [DISCONTINUED] escitalopram (LEXAPRO) 10 MG tablet       . [DISCONTINUED] lansoprazole (PREVACID) 30 MG capsule       . [DISCONTINUED] meloxicam (MOBIC) 15 MG tablet        . [DISCONTINUED] pramipexole (MIRAPEX) 0.25 MG tablet       . [DISCONTINUED] simvastatin (ZOCOR) 40 MG tablet       . [DISCONTINUED] traMADol (ULTRAM) 50 MG tablet       . [DISCONTINUED] VESICARE 10 MG tablet Take 1 tablet by mouth daily.       No facility-administered encounter medications on file as of 01/11/2013.  : Review of Systems  Neurological:       Memory loss, restless leg  Psychiatric/Behavioral: Positive for dysphoric mood. The patient is nervous/anxious.     Objective:  Neurologic Exam  Physical Exam Physical Examination:  Filed Vitals:   01/11/13 1141  BP: 123/76  Pulse: 72  Temp: 97.2 F (36.2 C)   General Examination: The patient is a very pleasant 79 y.o. female in no acute distress.   HEENT: Normocephalic, atraumatic, pupils are equal, round and reactive to light and accommodation. Funduscopic exam is normal with sharp disc margins noted. Extraocular tracking shows mild saccadic breakdown without nystagmus noted. There is limitation to upper gaze. There is no significant decrease in eye blink rate. Hearing is intact. Tympanic membranes are clear bilaterally. Face is symmetric with very minimal facial masking and normal facial sensation. There is no lip, neck or jaw tremor. Neck is very mildly rigid with intact passive ROM. There are no carotid bruits on auscultation. Oropharynx exam reveals mild mouth dryness. No significant airway crowding is noted. Mallampati is class II. Tongue protrudes centrally and palate elevates symmetrically.   There is no drooling.   Chest: is clear to auscultation without wheezing, rhonchi or crackles noted.  Heart: sounds are regular and normal without murmurs, rubs or gallops noted.   Abdomen: is soft, non-tender and non-distended with normal bowel sounds appreciated on auscultation.  Extremities: There is no pitting edema in the distal lower extremities bilaterally.   Skin: is warm and dry with no trophic changes noted.  Age-related changes are noted on the skin.   Musculoskeletal: exam reveals no obvious joint deformities, tenderness, joint swelling or erythema.  Neurologically:  Mental status: The patient is awake and alert, paying good  attention. She is able to partially provide the history. Her husband provides details. She is oriented to: person, situation, month of year and year. Her memory, attention, language and knowledge are impaired mildly. There is no aphasia, agnosia, apraxia or anomia. There is a mild degree of bradyphrenia. Speech is mildly hypophonic with no dysarthria noted. Mood is congruent and affect is normal.   Her MMSE is 23/30 today. Her CDT is 2/4 today.  Her AFT is 12 today.   Cranial nerves are as described above under HEENT exam. In addition, shoulder shrug is normal with equal shoulder height noted.  Motor exam: Normal bulk, and strength for age is noted. There are no dyskinesias noted. Tone is very mildly rigid with absence of cogwheeling. There is overall mild bradykinesia. There is no drift or rebound.  There is no tremor. Romberg is not tested.   Reflexes are 1+ in the upper extremities and 1+ in the lower extremities.  Fine motor skills exam: Finger taps are mildly impaired on the right and mildly impaired on the left. Hand movements are mildly impaired on the right and mildly impaired on the left. RAP (rapid alternating patting) is mildly impaired on the right and mildly impaired on the left. Foot taps are mildly impaired on the right and mildly impaired on the left. Foot agility (in the form of heel stomping) is mildly impaired on the right and mildly impaired on the left.    Cerebellar testing shows no dysmetria or intention tremor on finger to nose testing. There is no truncal or gait ataxia.   Sensory exam is intact to light touch, pinprick, vibration, temperature sense and proprioception in the upper and lower extremities.   Gait, station and balance: She stands up from  the seated position with mild difficulty and does not need to push up with Her hands. She needs no assistance. No veering to one side is noted. She is not noted to lean to the side. Posture is moderately stooped, but  could be age-appropriate. Stance is wide-based. She walks with decrease in stride length and pace, but fairly preserved arm swing. She walks cautiously and has slight difficulty turning. Tandem walk is not possible. Balance is mildly impaired. She is not able to do a toe or heel stance.      Assessment and Plan:   In summary, Kelly Brady is a very pleasant 79 y.o.-year old female with a history of gait disorder, and memory loss. Her memory has indeed worsened. She lost 3 points in her MMSE, 2 points in her clock drawing as well as 3 points in her category fluency since January of this year. I would like to initiate treatment for dementia. Previously she was tried on Aricept but per husband she had worsening of her hallucinations. I would like to try her on Exelon patch. I provided samples for one week for the 4.6 mg per 24-hour strength. I also provided them with a free 30 day trial with prescription and coupon for 4.6 mg strength. I've advised him to call in about 3 weeks to report how she is tolerating this and at that point we can perhaps increase the strength to 9.5 mg strength. I talked to them about potential side effects including dizziness, lightheadedness, hallucinations, balance problems. They demonstrated understanding. She may have mild parkinsonism. They have tried to take her off medication in the past but she felt worse. I am not convinced that she has idiopathic Parkinson's disease. 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 She does have gait dysfunction. I have advised her to use her cane more consistently. Thankfully she has not fallen recently. I would like to see her back in 4 months from now, sooner if the need arises and have encouraged her husband to call in a  few weeks to give [place] an update. They were in agreement.

## 2013-01-14 ENCOUNTER — Telehealth: Payer: Self-pay | Admitting: Neurology

## 2013-01-14 NOTE — Telephone Encounter (Signed)
Dr. Frances Furbish, Please call patient. I am leaving at 4 p.m. So am not available to return this call. Thank you.

## 2013-01-14 NOTE — Telephone Encounter (Signed)
Please ask patient what reaction she is having on the medication. Please ask her to stop medication right away if she believes she is having a reaction. Please inquire.

## 2013-01-15 DIAGNOSIS — H40009 Preglaucoma, unspecified, unspecified eye: Secondary | ICD-10-CM | POA: Diagnosis not present

## 2013-01-15 NOTE — Telephone Encounter (Signed)
Patient was having increased headaches and tremors at lowest starting dose of Excelon.  Husband was concerned after looking up side effects that patient takes pills for all the side effects including depression, anxiety, urine problems.He was worried that increasing patient to next dose will cause a problem for his wife.  I advised him that Dr. Frances Furbish said that patient should stop the medication right away if there was a concern of side effects from the medication. Husband said he did stop it.   Patient has her next appointment in January, 2015. I let husband know that I will refer this information to Dr. Frances Furbish. Should she want to see patient sooner or add a different medication, I will call him back. Should she want to see patient in January and just keep patient off the Excelon then he will not hear back from me. Husband said, that would be fine. I thanked him for his call.

## 2013-03-05 ENCOUNTER — Ambulatory Visit: Payer: Self-pay

## 2013-03-05 DIAGNOSIS — Z1231 Encounter for screening mammogram for malignant neoplasm of breast: Secondary | ICD-10-CM | POA: Diagnosis not present

## 2013-05-13 DIAGNOSIS — E782 Mixed hyperlipidemia: Secondary | ICD-10-CM | POA: Diagnosis not present

## 2013-05-13 DIAGNOSIS — R262 Difficulty in walking, not elsewhere classified: Secondary | ICD-10-CM | POA: Diagnosis not present

## 2013-05-13 DIAGNOSIS — I959 Hypotension, unspecified: Secondary | ICD-10-CM | POA: Diagnosis not present

## 2013-05-13 DIAGNOSIS — G250 Essential tremor: Secondary | ICD-10-CM | POA: Diagnosis not present

## 2013-05-13 DIAGNOSIS — G252 Other specified forms of tremor: Secondary | ICD-10-CM | POA: Diagnosis not present

## 2013-05-14 ENCOUNTER — Encounter: Payer: Self-pay | Admitting: Neurology

## 2013-05-14 ENCOUNTER — Encounter (INDEPENDENT_AMBULATORY_CARE_PROVIDER_SITE_OTHER): Payer: Self-pay

## 2013-05-14 ENCOUNTER — Ambulatory Visit (INDEPENDENT_AMBULATORY_CARE_PROVIDER_SITE_OTHER): Payer: Medicare Other | Admitting: Neurology

## 2013-05-14 VITALS — BP 148/82 | HR 72 | Temp 97.8°F | Ht 61.0 in | Wt 155.0 lb

## 2013-05-14 DIAGNOSIS — R269 Unspecified abnormalities of gait and mobility: Secondary | ICD-10-CM | POA: Diagnosis not present

## 2013-05-14 DIAGNOSIS — F0390 Unspecified dementia without behavioral disturbance: Secondary | ICD-10-CM

## 2013-05-14 DIAGNOSIS — G20 Parkinson's disease: Secondary | ICD-10-CM | POA: Diagnosis not present

## 2013-05-14 DIAGNOSIS — N390 Urinary tract infection, site not specified: Secondary | ICD-10-CM | POA: Diagnosis not present

## 2013-05-14 NOTE — Patient Instructions (Addendum)
I think overall you are doing fairly well but I do want to suggest a few things today:  Remember to drink plenty of fluid, eat healthy meals and do not skip any meals. Try to eat protein with a every meal and eat a healthy snack such as fruit or nuts in between meals. Try to keep a regular sleep-wake schedule and try to exercise daily, particularly in the form of walking, 20-30 minutes a day, if you can.   Engage in social activities in your community and with your family and try to keep up with current events by reading the newspaper or watching the news.   As far as your medications are concerned, I would like to suggest no changes as yet.     As far as diagnostic testing: no new test needed.   I would like to see you back in 4 months, sooner if we need to. Please call us with any interim questions, concerns, problems, updates or refill requests.  Our nursing staff will answer any of your questions and relay your messages to me and also relay most of my messages to you.  Our phone number is [phone]. We also have an after hours call service for urgent matters and there is a physician on-call for urgent questions. For any emergencies you know to call 911 or go to the nearest emergency room.

## 2013-05-14 NOTE — Progress Notes (Signed)
Subjective:    Patient ID: Kelly Brady is a 80 y.o. female.  HPI    Interim history:   Ms. Weesner is a very pleasant 80 year old right-handed woman who presents for followup consultation of her memory loss, gait disorder and mild parkinsonism. She is accompanied by her husband again today. I last saw her on 01/11/2013, at which time I felt that she had gait disorder and memory loss, an I re-initiated treatment for dementia. She had previously tried Aricept but had worsening hallucinations. I suggested a trial of Exelon patch. She had been tried off her Parkinson's medication but felt worse. 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 I asked her to use her cane more consistently. In the interim, they called on 01/14/2013 reporting that she had more tremors and headaches after starting the new medication and they were advised to stop the Exelon patch.  Today, she and her husband report that she has had some VH since last week. She saw Dr. Humphrey Rolls yesterday and had UA to check for UTI. She also stopped the BP medication.    I first met her on 09/11/12, at which time she was reported significant daytime somnolence and I asked her to cut back on her medications because of her sleepiness and her dyskinesias. I suggested carbidopa-levodopa CR 50-200 mg strength one tablet 3 times a day, namely at 8 AM, 2 PM and 8 PM and pramipexole 0.25 mg only at bedtime, but she was still on bid when I saw her back. She was advised to use her cane or her walker for safety at all times.  She previously followed with Dr. Morene Antu and was last seen by him on 05/14/2012, at which time Dr. Erling Cruz reduced her carbidopa-levodopa because of her hallucinations and dyskinesias.  She has an underlying medical history of migraines, anxiety, hypertension, arthritis, depression and hyperlipidemia. Her husband reported that she  was rather sleepy during the day. She kicks in her sleep. She fell a few months ago in the bathroom without head injury or LOC. Prior to seeing Dr. Erling Cruz, she was followed by Dr. Claiborne Billings in [place] and Dr. Fayrene Helper with a history of difficulty walking, poor balance, with the absence of tremor. She has was tried on Requip generic and carbidopa-levodopa. She was seen by Dr. Jacqulynn Cadet at [facility] in 2009 who did not feel that she had Parkinson's disease and tapered her off her medications. She become worse and was restarted on medication by Dr. Humphrey Rolls in 2010. She had vertebroplasty d/t vertebral fractures. She also had lumbar spine fusion and March 2008. She was seen by Dr. Erling Cruz in February 2011, who felt that she had parkinsonism. She has a history of hallucinations and there was concern for LBD. She also had symptoms suggestive of RBD. She has had recurrent falls.  MRI of the brain with and without contrast in May 2007 showed subcortical and deep white matter chronic ischemic changes. MRI C-spine and May 2007 showed mild degenerative changes. MRI L-spine in May 2007 showed spondylolisthesis. Sensory evoked potentials, TSH, ACE level and ANA as well as ESR and vitamin B12 were normal in July 2011. Her vitamin D was low. Rheumatoid factor was increased. Anti-CCP was negative. She developed dyskinesias. She developed memory problems. In January 2014 her MMSE was 26, clock drawing was 4, animal fluency was 15. Her falls assessment tool score was 20.  She had hallucinations  with Aricept.   Her Past Medical History Is Significant For: Past Medical History  Diagnosis Date  . Closed fracture of unspecified part of vertebral column without mention of spinal cord injury   . Lumbago   . Rheumatoid arthritis(714.0)   . Unspecified vitamin D deficiency   . Disturbance of skin sensation   . Paralysis agitans   . Dementia without behavioral disturbance 01/11/2013    Her Past Surgical History Is Significant For: Past  Surgical History  Procedure Laterality Date  . Lumbar spine surgery  2008  . Cataract extraction Right 2013  . Vertebroplasty  2011  . Melanoma excision Left 2013    arm    Her Family History Is Significant For: Family History  Problem Relation Age of Onset  . Kidney failure Mother   . Stroke Father     Her Social History Is Significant For: History   Social History  . Marital Status: Married    Spouse Name: [name]    Number of Children: 2  . Years of Education: College   Occupational History  . Retired    Social History Main Topics  . Smoking status: Former Smoker    Types: Cigarettes    Quit date: 09/12/1962  . Smokeless tobacco: None  . Alcohol Use: No  . Drug Use: No  . Sexual Activity: None   Other Topics Concern  . None   Social History Narrative   Patient lives at home with spouse.   Coffee Use: 1-2 cups daily; sodas occasionally    Her Allergies Are:  No Known Allergies:   Her Current Medications Are:  Outpatient Encounter Prescriptions as of 05/14/2013  Medication Sig  . amitriptyline (ELAVIL) 10 MG tablet Take 10 mg by mouth at bedtime.  Marland Kitchen CALCIUM PO Take 1 tablet by mouth daily.  . carbidopa-levodopa (SINEMET CR) 50-200 MG per tablet Take 1 tablet by mouth 2 (two) times daily.  Marland Kitchen escitalopram (LEXAPRO) 10 MG tablet Take 10 mg by mouth daily.  . pramipexole (MIRAPEX) 0.25 MG tablet Take 0.25 mg by mouth 2 (two) times daily.  . simvastatin (ZOCOR) 40 MG tablet Take 40 mg by mouth every evening.  . solifenacin (VESICARE) 10 MG tablet Take 10 mg by mouth daily.  . [DISCONTINUED] bisoprolol-hydrochlorothiazide (ZIAC) 5-6.25 MG per tablet Take 0.5 tablets by mouth daily.  . [DISCONTINUED] lansoprazole (PREVACID) 30 MG capsule Take 30 mg by mouth daily.  . [DISCONTINUED] rivastigmine (EXELON) 4.6 mg/24hr To be used with free 30 day trial coupon.  :  Review of Systems:  Out of a complete 14 point review of systems, all are reviewed and negative with  the exception of these symptoms as listed below:   Review of Systems  Musculoskeletal: Positive for back pain and gait problem.       Coordination problem  Neurological:       Memory loss  Psychiatric/Behavioral: Positive for hallucinations and confusion. Negative for sleep disturbance (daytime sleepiness).    Objective:  Neurologic Exam  Physical Exam Physical Examination:   Filed Vitals:   05/14/13 1511  BP: 148/82  Pulse: 72  Temp: 97.8 F (36.6 C)   General Examination: The patient is a very pleasant 80 y.o. female in no acute distress.   HEENT: Normocephalic, atraumatic, pupils are equal, round and reactive to light and accommodation. Funduscopic exam is normal with sharp disc margins noted. She is status post cataract surgery in the right. Extraocular tracking shows mild saccadic breakdown without nystagmus noted. There is  limitation to upper gaze. There is no significant decrease in eye blink rate. Hearing is intact. Face is symmetric with very minimal facial masking and normal facial sensation. There is no lip, neck or jaw tremor. Neck is very mildly rigid with intact passive ROM. There are no carotid bruits on auscultation. Oropharynx exam reveals moderate mouth dryness. No significant airway crowding is noted. Mallampati is class II. Tongue protrudes centrally and palate elevates symmetrically. There is no drooling.   Chest: is clear to auscultation without wheezing, rhonchi or crackles noted.  Heart: sounds are regular and normal without murmurs, rubs or gallops noted.   Abdomen: is soft, non-tender and non-distended with normal bowel sounds appreciated on auscultation.  Extremities: There is no pitting edema in the distal lower extremities bilaterally. Chronic hyperpigmentation is seen.   Skin: is warm and dry with no trophic changes noted. Age-related changes are noted on the skin.   Musculoskeletal: exam reveals no obvious joint deformities, tenderness, joint  swelling or erythema.  Neurologically:  Mental status: The patient is awake and alert, paying good  attention. She is able to partially provide the history. Her husband provides details. She is oriented to: person, situation, month of year and year. Her memory, attention, language and knowledge are impaired mildly. There is no aphasia, agnosia, apraxia or anomia. There is a mild degree of bradyphrenia. Speech is mildly hypophonic with no dysarthria noted. Mood is congruent and affect is normal.  On 01/11/13:   Her MMSE was 23/30, her CDT was 2/4 today, her AFT was 12.  On 05/16/13: Her AFT was 16/minute.   Cranial nerves are as described above under HEENT exam. In addition, shoulder shrug is normal with equal shoulder height noted.  Motor exam: Normal bulk, and strength for age is noted. There are no dyskinesias noted. Tone is very mildly rigid with absence of cogwheeling. There is overall mild bradykinesia. There is no drift or rebound.  There is no tremor. Romberg is not tested.   Reflexes are 1+ in the upper extremities and 1+ in the lower extremities.  Fine motor skills exam: Finger taps are mildly impaired on the right and mildly impaired on the left. Hand movements are mildly impaired on the right and mildly impaired on the left. RAP (rapid alternating patting) is mildly impaired on the right and mildly impaired on the left. Foot taps are mildly impaired on the right and mildly impaired on the left. Foot agility (in the form of heel stomping) is mildly impaired on the right and mildly impaired on the left.    Cerebellar testing shows no dysmetria or intention tremor on finger to nose testing. There is no truncal or gait ataxia.   Sensory exam is intact to light touch, pinprick, vibration, temperature sense in the upper and lower extremities.   Gait, station and balance: She stands up from the seated position with mild difficulty and does not need to push up with Her hands. She needs no  assistance. No veering to one side is noted. She is not noted to lean to the side. Posture is moderately stooped, but could be age-appropriate. Stance is wide-based. She walks with decrease in stride length and pace, but fairly preserved arm swing. She walks cautiously and has slight difficulty turning. Tandem walk is not possible. Balance is mildly impaired. She is not able to do a toe or heel stance. She walks more securely with her cane.     Assessment and Plan:   In summary, Kelly Herrero  Brady is a very pleasant 80 year old female with a history of gait disorder, memory loss and parkinsonism. In 9/14, her memory had indeed worsened and she lost 3 points in her MMSE, 2 points in her clock drawing as well as 3 points in her category fluency as compared to January of 2014. She was not able to tolerate a recent trial of exelon patch and did not tolerate Aricept in the past. She recently had some recurrence of visual hallucinations and she had a UA, for which the results are pending and she was taken off of her BP medication. Her memory is fairly stable and I would like to hold off on any new medication. Her husband is reluctant to try her on anything new at this time. It is also worthwhile waiting for her UA results and making sure her hallucinations do not get worse. Her blood pressures may need to be monitored now that she's off of her BP med. At this juncture, suggested a 4 month recheck on her memory and we will decide if it is worthwhile trying her on another memory medication at the time. They were in agreement. I answered all her questions today. I encouraged her to drink more fluid. Both her vesicare and her amitriptyline can cause mouth dryness. She has mild parkinsonism, but responded to levodopa therapy. In the past she was tried off levodopa therapy but felt worse. Again, she does not have telltale signs of idiopathic Parkinson's disease. 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 She has  gait dysfunction. I have advised her to use her cane more consistently. I would like to see her back in 4 months from now, sooner if the need arises and have encouraged her husband to call with any update. They were in agreement.

## 2013-05-27 DIAGNOSIS — G250 Essential tremor: Secondary | ICD-10-CM | POA: Diagnosis not present

## 2013-05-27 DIAGNOSIS — G252 Other specified forms of tremor: Secondary | ICD-10-CM | POA: Diagnosis not present

## 2013-05-27 DIAGNOSIS — I10 Essential (primary) hypertension: Secondary | ICD-10-CM | POA: Diagnosis not present

## 2013-05-27 DIAGNOSIS — F411 Generalized anxiety disorder: Secondary | ICD-10-CM | POA: Diagnosis not present

## 2013-05-27 DIAGNOSIS — G63 Polyneuropathy in diseases classified elsewhere: Secondary | ICD-10-CM | POA: Diagnosis not present

## 2013-07-22 DIAGNOSIS — G250 Essential tremor: Secondary | ICD-10-CM | POA: Diagnosis not present

## 2013-07-22 DIAGNOSIS — I10 Essential (primary) hypertension: Secondary | ICD-10-CM | POA: Diagnosis not present

## 2013-07-22 DIAGNOSIS — R269 Unspecified abnormalities of gait and mobility: Secondary | ICD-10-CM | POA: Diagnosis not present

## 2013-07-22 DIAGNOSIS — R413 Other amnesia: Secondary | ICD-10-CM | POA: Diagnosis not present

## 2013-07-22 DIAGNOSIS — G252 Other specified forms of tremor: Secondary | ICD-10-CM | POA: Diagnosis not present

## 2013-07-22 DIAGNOSIS — R5381 Other malaise: Secondary | ICD-10-CM | POA: Diagnosis not present

## 2013-07-22 DIAGNOSIS — K219 Gastro-esophageal reflux disease without esophagitis: Secondary | ICD-10-CM | POA: Diagnosis not present

## 2013-07-22 DIAGNOSIS — I6529 Occlusion and stenosis of unspecified carotid artery: Secondary | ICD-10-CM | POA: Diagnosis not present

## 2013-07-22 DIAGNOSIS — F411 Generalized anxiety disorder: Secondary | ICD-10-CM | POA: Diagnosis not present

## 2013-07-22 DIAGNOSIS — R5383 Other fatigue: Secondary | ICD-10-CM | POA: Diagnosis not present

## 2013-08-06 ENCOUNTER — Emergency Department: Payer: Self-pay | Admitting: Emergency Medicine

## 2013-08-06 DIAGNOSIS — S298XXA Other specified injuries of thorax, initial encounter: Secondary | ICD-10-CM | POA: Diagnosis not present

## 2013-08-06 DIAGNOSIS — F0280 Dementia in other diseases classified elsewhere without behavioral disturbance: Secondary | ICD-10-CM | POA: Diagnosis not present

## 2013-08-06 DIAGNOSIS — Z79899 Other long term (current) drug therapy: Secondary | ICD-10-CM | POA: Diagnosis not present

## 2013-08-06 DIAGNOSIS — G3183 Dementia with Lewy bodies: Secondary | ICD-10-CM | POA: Diagnosis not present

## 2013-08-06 DIAGNOSIS — S0100XA Unspecified open wound of scalp, initial encounter: Secondary | ICD-10-CM | POA: Diagnosis not present

## 2013-08-06 DIAGNOSIS — I10 Essential (primary) hypertension: Secondary | ICD-10-CM | POA: Diagnosis not present

## 2013-08-06 DIAGNOSIS — S0190XA Unspecified open wound of unspecified part of head, initial encounter: Secondary | ICD-10-CM | POA: Diagnosis not present

## 2013-08-06 DIAGNOSIS — R0602 Shortness of breath: Secondary | ICD-10-CM | POA: Diagnosis not present

## 2013-08-06 DIAGNOSIS — S0993XA Unspecified injury of face, initial encounter: Secondary | ICD-10-CM | POA: Diagnosis not present

## 2013-08-06 DIAGNOSIS — F068 Other specified mental disorders due to known physiological condition: Secondary | ICD-10-CM | POA: Diagnosis not present

## 2013-08-06 LAB — COMPREHENSIVE METABOLIC PANEL
ALBUMIN: 4.4 g/dL (ref 3.4–5.0)
AST: 24 U/L (ref 15–37)
Alkaline Phosphatase: 93 U/L
Anion Gap: 7 (ref 7–16)
BUN: 19 mg/dL — AB (ref 7–18)
Bilirubin,Total: 0.5 mg/dL (ref 0.2–1.0)
CHLORIDE: 102 mmol/L (ref 98–107)
CO2: 26 mmol/L (ref 21–32)
Calcium, Total: 9.7 mg/dL (ref 8.5–10.1)
Creatinine: 0.87 mg/dL (ref 0.60–1.30)
Glucose: 98 mg/dL (ref 65–99)
OSMOLALITY: 272 (ref 275–301)
POTASSIUM: 3.9 mmol/L (ref 3.5–5.1)
SGPT (ALT): 12 U/L (ref 12–78)
Sodium: 135 mmol/L — ABNORMAL LOW (ref 136–145)
TOTAL PROTEIN: 8.9 g/dL — AB (ref 6.4–8.2)

## 2013-08-06 LAB — URINALYSIS, COMPLETE
BILIRUBIN, UR: NEGATIVE
BLOOD: NEGATIVE
Bacteria: NONE SEEN
Glucose,UR: NEGATIVE mg/dL (ref 0–75)
LEUKOCYTE ESTERASE: NEGATIVE
Nitrite: NEGATIVE
PH: 6 (ref 4.5–8.0)
Protein: 30
RBC,UR: 1 /HPF (ref 0–5)
SPECIFIC GRAVITY: 1.015 (ref 1.003–1.030)
Squamous Epithelial: 1
WBC UR: 1 /HPF (ref 0–5)

## 2013-08-06 LAB — CBC
HCT: 47.4 % — AB (ref 35.0–47.0)
HGB: 15.5 g/dL (ref 12.0–16.0)
MCH: 29.4 pg (ref 26.0–34.0)
MCHC: 32.7 g/dL (ref 32.0–36.0)
MCV: 90 fL (ref 80–100)
Platelet: 221 10*3/uL (ref 150–440)
RBC: 5.26 10*6/uL — AB (ref 3.80–5.20)
RDW: 13.3 % (ref 11.5–14.5)
WBC: 8.1 10*3/uL (ref 3.6–11.0)

## 2013-08-06 LAB — TROPONIN I

## 2013-08-12 ENCOUNTER — Emergency Department: Payer: Self-pay | Admitting: Emergency Medicine

## 2013-09-03 DIAGNOSIS — G252 Other specified forms of tremor: Secondary | ICD-10-CM | POA: Diagnosis not present

## 2013-09-03 DIAGNOSIS — M159 Polyosteoarthritis, unspecified: Secondary | ICD-10-CM | POA: Diagnosis not present

## 2013-09-03 DIAGNOSIS — Z79899 Other long term (current) drug therapy: Secondary | ICD-10-CM | POA: Diagnosis not present

## 2013-09-03 DIAGNOSIS — I10 Essential (primary) hypertension: Secondary | ICD-10-CM | POA: Diagnosis not present

## 2013-09-03 DIAGNOSIS — G250 Essential tremor: Secondary | ICD-10-CM | POA: Diagnosis not present

## 2013-09-03 DIAGNOSIS — E782 Mixed hyperlipidemia: Secondary | ICD-10-CM | POA: Diagnosis not present

## 2013-09-03 DIAGNOSIS — F411 Generalized anxiety disorder: Secondary | ICD-10-CM | POA: Diagnosis not present

## 2013-09-25 ENCOUNTER — Encounter (INDEPENDENT_AMBULATORY_CARE_PROVIDER_SITE_OTHER): Payer: Self-pay

## 2013-09-25 ENCOUNTER — Ambulatory Visit (INDEPENDENT_AMBULATORY_CARE_PROVIDER_SITE_OTHER): Payer: Medicare Other | Admitting: Neurology

## 2013-09-25 ENCOUNTER — Encounter: Payer: Self-pay | Admitting: Neurology

## 2013-09-25 VITALS — BP 146/80 | HR 80 | Temp 97.6°F | Ht 61.0 in | Wt 151.0 lb

## 2013-09-25 DIAGNOSIS — S63501A Unspecified sprain of right wrist, initial encounter: Secondary | ICD-10-CM

## 2013-09-25 DIAGNOSIS — S63509A Unspecified sprain of unspecified wrist, initial encounter: Secondary | ICD-10-CM

## 2013-09-25 DIAGNOSIS — R296 Repeated falls: Secondary | ICD-10-CM

## 2013-09-25 DIAGNOSIS — R269 Unspecified abnormalities of gait and mobility: Secondary | ICD-10-CM

## 2013-09-25 DIAGNOSIS — G20A1 Parkinson's disease without dyskinesia, without mention of fluctuations: Secondary | ICD-10-CM

## 2013-09-25 DIAGNOSIS — Z9181 History of falling: Secondary | ICD-10-CM

## 2013-09-25 DIAGNOSIS — G20 Parkinson's disease: Secondary | ICD-10-CM | POA: Diagnosis not present

## 2013-09-25 DIAGNOSIS — F0390 Unspecified dementia without behavioral disturbance: Secondary | ICD-10-CM | POA: Diagnosis not present

## 2013-09-25 NOTE — Patient Instructions (Signed)
Please get in touch with Dr. Laurelyn Sickle office to get an order for a wrist x-ray on the right hand. We will not try any new dementia medications at this point as you have been very sensitive.

## 2013-09-25 NOTE — Progress Notes (Signed)
Subjective:    Patient ID: Kelly Brady is a 80 y.o. female.  HPI    Interim history:  Kelly Brady is a very pleasant 80year-old right-handed woman who presents for followup consultation of her memory loss, gait disorder and mild parkinsonism. She is accompanied by her husband again today.  I last saw her on 05/14/2013, at which time we talked about her medication trials. She was not able to tolerate Exelon patch and did not tolerate Aricept in the past. She had had a urinary tract infection and we talked about starting her on another memory medicine but mutually decided to hold off as she was still recuperating from a recent urinary tract infection and had been taken off of her blood pressure medication recently and we did not feel it was safe to rock the boat at the time.  Today, he reports, that she was tried on Namenda by her PCP about 2 months ago, but when she got up to 14 mg daily, she had worse balance and fell at home on the way to the kitchen and hit the back of her head, hitting the door, no LOC and had a laceration. Her husband took her to [facility] ER after he took her to an UC, and she had a CTH, which was negative. She did have some R wrist pain and swelling and fell again about a week ago and hurt her R wrist again. She has been having mild dyskinesias, but is afraid to come off of the C/L and mirapex. She has hallucinations nearly daily, which started years ago, but started after the parkinsonism. She "sees" a little girl and her sister almost daily. She is not scared by the hallucinations, except the first time, when she "saw" the girl had a knife. Per husband, she is stable at this time. Her hallucinations started after her parkinsonism and her memory loss as I understand.  I saw her on 01/11/2013, at which time I felt that she had gait disorder and memory loss, an I re-initiated treatment for dementia. She had previously tried Aricept but had worsening hallucinations. I  suggested a trial of Exelon patch. She had been tried off her Parkinson's medication but felt worse. 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 I asked her to use her cane more consistently.  In the interim, they called on 01/14/2013 reporting that she had more tremors and headaches after starting the new medication and they were advised to stop the Exelon patch.   I first met her on 09/11/12, at which time she was reported significant daytime somnolence and I asked her to cut back on her medications because of her sleepiness and her dyskinesias. I suggested carbidopa-levodopa CR 50-200 mg strength one tablet 3 times a day, namely at 8 AM, 2 PM and 8 PM and pramipexole 0.25 mg only at bedtime, but she was still on bid when I saw her back. She was advised to use her cane or her walker for safety at all times.   She previously followed with Dr. Morene Antu and was last seen by him on 05/14/2012, at which time Dr. Erling Cruz reduced her carbidopa-levodopa because of her hallucinations and dyskinesias.  She has an underlying medical history of migraines, anxiety, hypertension, arthritis, depression and hyperlipidemia. Her husband reported that she was rather sleepy during the day. She kicks in her sleep. She fell a few months ago in the  bathroom without head injury or LOC. Prior to seeing Dr. Erling Cruz, she was followed by Dr. Claiborne Billings in [place] and Dr. Fayrene Helper with a history of difficulty walking, poor balance, with the absence of tremor. She has was tried on Requip generic and carbidopa-levodopa. She was seen by Dr. Jacqulynn Cadet at [facility] in 2009 who did not feel that she had Parkinson's disease and tapered her off her medications. She become worse and was restarted on medication by Dr. Humphrey Rolls in 2010. She had vertebroplasty d/Kelly vertebral fractures. She also had lumbar spine fusion and March 2008. She was seen by Dr. Erling Cruz in  February 2011, who felt that she had parkinsonism. She has a history of hallucinations and there was concern for LBD. She also had symptoms suggestive of RBD. She has had recurrent falls.  MRI of the brain with and without contrast in May 2007 showed subcortical and deep white matter chronic ischemic changes. MRI C-spine and May 2007 showed mild degenerative changes. MRI L-spine in May 2007 showed spondylolisthesis. Sensory evoked potentials, TSH, ACE level and ANA as well as ESR and vitamin B12 were normal in July 2011. Her vitamin D was low. Rheumatoid factor was increased. Anti-CCP was negative. She developed dyskinesias. She developed memory problems. In January 2014 her MMSE was 26, clock drawing was 4, animal fluency was 15. Her falls assessment tool score was 20.  She had hallucinations with Aricept.   Her Past Medical History Is Significant For: Past Medical History  Diagnosis Date  . Closed fracture of unspecified part of vertebral column without mention of spinal cord injury   . Lumbago   . Rheumatoid arthritis(714.0)   . Unspecified vitamin D deficiency   . Disturbance of skin sensation   . Paralysis agitans   . Dementia without behavioral disturbance 01/11/2013    Her Past Surgical History Is Significant For: Past Surgical History  Procedure Laterality Date  . Lumbar spine surgery  2008  . Cataract extraction Right 2013  . Vertebroplasty  2011  . Melanoma excision Left 2013    arm    Her Family History Is Significant For: Family History  Problem Relation Age of Onset  . Kidney failure Mother   . Stroke Father     Her Social History Is Significant For: History   Social History  . Marital Status: Married    Spouse Name: Kelly Brady    Number of Children: 2  . Years of Education: College   Occupational History  . Retired    Social History Main Topics  . Smoking status: Former Smoker    Types: Cigarettes    Quit date: 09/12/1962  . Smokeless tobacco: None  . Alcohol  Use: No  . Drug Use: No  . Sexual Activity: None   Other Topics Concern  . None   Social History Narrative   Patient lives at home with spouse.   Coffee Use: 1-2 cups daily; sodas occasionally    Her Allergies Are:  Allergies  Allergen Reactions  . Latex Anaphylaxis  :   Her Current Medications Are:  Outpatient Encounter Prescriptions as of 09/25/2013  Medication Sig  . amitriptyline (ELAVIL) 10 MG tablet Take 10 mg by mouth at bedtime.  Marland Kitchen CALCIUM PO Take 1 tablet by mouth daily.  . carbidopa-levodopa (SINEMET CR) 50-200 MG per tablet Take 1 tablet by mouth 2 (two) times daily.  Marland Kitchen diltiazem (CARDIZEM CD) 120 MG 24 hr capsule Take 1 capsule by mouth daily.  Marland Kitchen escitalopram (LEXAPRO) 10 MG tablet  Take 10 mg by mouth daily.  . lansoprazole (PREVACID) 30 MG capsule Take 1 capsule by mouth daily.  . pramipexole (MIRAPEX) 0.25 MG tablet Take 0.25 mg by mouth 2 (two) times daily.  . simvastatin (ZOCOR) 40 MG tablet Take 40 mg by mouth every evening.  . solifenacin (VESICARE) 10 MG tablet Take 10 mg by mouth daily.  Marland Kitchen triamcinolone cream (KENALOG) 0.1 %   :  Review of Systems:  Out of a complete 14 point review of systems, all are reviewed and negative with the exception of these symptoms as listed below:   Review of Systems  Constitutional: Negative.   HENT: Negative.   Eyes: Negative.   Respiratory: Negative.   Cardiovascular: Positive for leg swelling.  Gastrointestinal: Negative.   Endocrine: Positive for polydipsia.  Genitourinary: Negative.   Musculoskeletal: Positive for arthralgias and gait problem.  Skin: Negative.   Allergic/Immunologic: Negative.   Neurological: Positive for speech difficulty.       Memory loss  Hematological: Negative.   Psychiatric/Behavioral: Positive for hallucinations, confusion and sleep disturbance (sleep talking, e.d.s.).    Objective:  Neurologic Exam  Physical Exam Physical Examination:   Filed Vitals:   09/25/13 1124  BP:  146/80  Pulse: 80  Temp: 97.6 F (36.4 C)   General Examination: The patient is a very pleasant 80 y.o. female in no acute distress.   HEENT: Normocephalic, atraumatic, pupils are equal, round and reactive to light and accommodation. Funduscopic exam is normal with sharp disc margins noted. She is status post cataract surgery in the right. Extraocular tracking shows mild saccadic breakdown without nystagmus noted. There is limitation to upper gaze. There is no significant decrease in eye blink rate. Hearing is intact. Face is symmetric with very minimal facial masking and normal facial sensation. There is no lip, neck or jaw tremor. Neck is very mildly rigid with intact passive ROM. There are no carotid bruits on auscultation. Oropharynx exam reveals moderate mouth dryness. No significant airway crowding is noted. Mallampati is class II. Tongue protrudes centrally and palate elevates symmetrically. There is no drooling.   Chest: is clear to auscultation without wheezing, rhonchi or crackles noted.  Heart: sounds are regular and normal without murmurs, rubs or gallops noted.   Abdomen: is soft, non-tender and non-distended with normal bowel sounds appreciated on auscultation.  Extremities: There is no pitting edema in the distal lower extremities bilaterally. Chronic hyperpigmentation is seen.   Skin: is warm and dry with no trophic changes noted. Age-related changes are noted on the skin.   Musculoskeletal: exam reveals no obvious joint deformities, tenderness, joint swelling or erythema, with the exception of R wrist swelling, mild bruising and pain with wrist flexion and extension.  Neurologically:  Mental status: The patient is awake and alert, paying good  attention. She is able to partially provide the history. Her husband provides details. She is oriented to: person, situation, month of year and year. Her memory, attention, language and knowledge are impaired mildly. There is no aphasia,  agnosia, apraxia or anomia. There is a mild degree of bradyphrenia. Speech is mildly hypophonic with no dysarthria noted. Mood is congruent and affect is normal. She has mild LE dyskinesias.   On 01/11/13:   Her MMSE was 23/30, her CDT was 2/4 today, her AFT was 12.   On 05/13/13: Her AFT was 16/minute.   On 09/25/13: MMSE: 24/30, CDT 2/4, AFT 11.   Cranial nerves are as described above under HEENT exam. In addition, shoulder  shrug is normal with equal shoulder height noted.  Motor exam: Normal bulk, and strength for age is noted. There are no dyskinesias noted. Tone is very mildly rigid with absence of cogwheeling. There is overall mild bradykinesia. There is no drift or rebound.  There is no tremor. Romberg is not tested.   Reflexes are 1+ in the upper extremities and 1+ in the lower extremities.  Fine motor skills exam: Finger taps are mildly impaired on the right and mildly impaired on the left. Hand movements are mildly impaired on the right and mildly impaired on the left. RAP (rapid alternating patting) is mildly impaired on the right and mildly impaired on the left. Foot taps are mildly impaired on the right and mildly impaired on the left. Foot agility (in the form of heel stomping) is mildly impaired on the right and mildly impaired on the left.    Cerebellar testing shows no dysmetria or intention tremor on finger to nose testing. There is no truncal or gait ataxia.   Sensory exam is intact to light touch.   Gait, station and balance: She stands up from the seated position with mild difficulty and does not need to push up with Her hands. She needs no assistance. No veering to one side is noted. She is not noted to lean to the side. Posture is moderately stooped, but could be age-appropriate. Stance is wide-based. She walks with decrease in stride length and pace, but fairly preserved arm swing. She walks cautiously and has slight difficulty turning. Tandem walk is not possible. Balance is  mildly impaired. She is not able to do a toe or heel stance. She did not bring her cane today.     Assessment and Plan:   In summary, TASHAWNDA BLEILER is a very pleasant 80 year old female with a history of gait disorder, memory loss and parkinsonism. In 9/14, her memory had indeed worsened and she lost 3 points in her MMSE, 2 points in her clock drawing as well as 3 points in her category fluency as compared to January of 2014. Her numbers are fairly stable today. Unfortunately, she was not able to tolerate Exelon patch and did not tolerate Aricept in the past and most recently did not tolerate Namenda XR, per PCP's trial. She fell and hit her head. She has right wrist swelling and pain with mild bruising noted. She fell again recently inside the house missing a chair and hurt her right wrist again. I really would recommend that she have it x-rayed. They do not wish to pursue this here because they live in [place] and would like to pursue it there. They will get in touch with her primary care physician for an order for an x-ray close to home. She has been having some visual hallucinations recently which are currently nonthreatening and she has good insight. She is on verapamil for blood pressure which is new. We mutually agreed not to try her on any new memory medication at this time. She has not tried galanthamine. As far as her parkinsonism it is rather mild and I do not believe she has idiopathic Parkinson's disease. Nevertheless, she has responded to Sinemet and is very reluctant to come off of it and her husband is very reluctant to try to reduce it at this juncture. She is on Sinemet long-acting 50-200 mg strength one tablet 3 times a day as well as Mirapex low-dose 0.25 mg once daily and we mutually agreed to keep her on those medications.  They were in agreement. I answered all her questions today. I encouraged her to drink more fluid. In the past she was tried off levodopa therapy but felt worse.  Again, she does not have telltale signs of idiopathic Parkinson's disease. 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 She has gait dysfunction, has fallen and injured herself and strongly advised to start using a cane. I would like to see her back in 4 months from now, sooner if the need arises and have encouraged her husband to call with any update, especially after she's had her wrist x-ray.

## 2013-09-26 DIAGNOSIS — M25539 Pain in unspecified wrist: Secondary | ICD-10-CM | POA: Diagnosis not present

## 2013-09-26 DIAGNOSIS — S52509A Unspecified fracture of the lower end of unspecified radius, initial encounter for closed fracture: Secondary | ICD-10-CM | POA: Diagnosis not present

## 2013-10-01 DIAGNOSIS — G4700 Insomnia, unspecified: Secondary | ICD-10-CM | POA: Insufficient documentation

## 2013-10-01 DIAGNOSIS — I10 Essential (primary) hypertension: Secondary | ICD-10-CM | POA: Insufficient documentation

## 2013-10-01 DIAGNOSIS — M810 Age-related osteoporosis without current pathological fracture: Secondary | ICD-10-CM | POA: Insufficient documentation

## 2013-10-01 DIAGNOSIS — F329 Major depressive disorder, single episode, unspecified: Secondary | ICD-10-CM | POA: Insufficient documentation

## 2013-10-01 DIAGNOSIS — G43909 Migraine, unspecified, not intractable, without status migrainosus: Secondary | ICD-10-CM | POA: Insufficient documentation

## 2013-10-01 DIAGNOSIS — Z86010 Personal history of colonic polyps: Secondary | ICD-10-CM | POA: Insufficient documentation

## 2013-10-01 DIAGNOSIS — G20 Parkinson's disease: Secondary | ICD-10-CM | POA: Insufficient documentation

## 2013-10-01 DIAGNOSIS — K5790 Diverticulosis of intestine, part unspecified, without perforation or abscess without bleeding: Secondary | ICD-10-CM | POA: Insufficient documentation

## 2013-10-01 DIAGNOSIS — E785 Hyperlipidemia, unspecified: Secondary | ICD-10-CM | POA: Insufficient documentation

## 2013-10-01 DIAGNOSIS — F32A Depression, unspecified: Secondary | ICD-10-CM | POA: Insufficient documentation

## 2013-10-01 DIAGNOSIS — M1990 Unspecified osteoarthritis, unspecified site: Secondary | ICD-10-CM | POA: Insufficient documentation

## 2013-10-01 DIAGNOSIS — N952 Postmenopausal atrophic vaginitis: Secondary | ICD-10-CM | POA: Insufficient documentation

## 2013-10-04 DIAGNOSIS — M20029 Boutonniere deformity of unspecified finger(s): Secondary | ICD-10-CM | POA: Diagnosis not present

## 2013-10-04 DIAGNOSIS — S52599A Other fractures of lower end of unspecified radius, initial encounter for closed fracture: Secondary | ICD-10-CM | POA: Diagnosis not present

## 2013-12-02 DIAGNOSIS — L570 Actinic keratosis: Secondary | ICD-10-CM | POA: Diagnosis not present

## 2013-12-02 DIAGNOSIS — D485 Neoplasm of uncertain behavior of skin: Secondary | ICD-10-CM | POA: Diagnosis not present

## 2013-12-02 DIAGNOSIS — Z85828 Personal history of other malignant neoplasm of skin: Secondary | ICD-10-CM | POA: Diagnosis not present

## 2013-12-02 DIAGNOSIS — C44721 Squamous cell carcinoma of skin of unspecified lower limb, including hip: Secondary | ICD-10-CM | POA: Diagnosis not present

## 2013-12-24 DIAGNOSIS — I10 Essential (primary) hypertension: Secondary | ICD-10-CM | POA: Diagnosis not present

## 2013-12-24 DIAGNOSIS — R262 Difficulty in walking, not elsewhere classified: Secondary | ICD-10-CM | POA: Diagnosis not present

## 2013-12-24 DIAGNOSIS — Z0000 Encounter for general adult medical examination without abnormal findings: Secondary | ICD-10-CM | POA: Diagnosis not present

## 2013-12-24 DIAGNOSIS — N393 Stress incontinence (female) (male): Secondary | ICD-10-CM | POA: Diagnosis not present

## 2013-12-24 DIAGNOSIS — G259 Extrapyramidal and movement disorder, unspecified: Secondary | ICD-10-CM | POA: Diagnosis not present

## 2013-12-24 DIAGNOSIS — Z23 Encounter for immunization: Secondary | ICD-10-CM | POA: Diagnosis not present

## 2013-12-24 DIAGNOSIS — K219 Gastro-esophageal reflux disease without esophagitis: Secondary | ICD-10-CM | POA: Diagnosis not present

## 2013-12-31 DIAGNOSIS — Z23 Encounter for immunization: Secondary | ICD-10-CM | POA: Diagnosis not present

## 2014-01-15 DIAGNOSIS — D0470 Carcinoma in situ of skin of unspecified lower limb, including hip: Secondary | ICD-10-CM | POA: Diagnosis not present

## 2014-03-03 ENCOUNTER — Ambulatory Visit: Payer: Medicare Other | Admitting: Neurology

## 2014-03-10 ENCOUNTER — Telehealth: Payer: Self-pay | Admitting: Neurology

## 2014-03-10 ENCOUNTER — Ambulatory Visit: Payer: Medicare Other | Admitting: Neurology

## 2014-03-10 NOTE — Telephone Encounter (Signed)
Called and rescheduled today's appointment , confirmed with husband

## 2014-03-31 ENCOUNTER — Encounter: Payer: Self-pay | Admitting: Neurology

## 2014-03-31 ENCOUNTER — Ambulatory Visit (INDEPENDENT_AMBULATORY_CARE_PROVIDER_SITE_OTHER): Payer: Medicare Other | Admitting: Neurology

## 2014-03-31 VITALS — BP 136/84 | HR 92 | Temp 97.5°F | Ht 61.0 in | Wt 148.0 lb

## 2014-03-31 DIAGNOSIS — R296 Repeated falls: Secondary | ICD-10-CM | POA: Diagnosis not present

## 2014-03-31 DIAGNOSIS — F0390 Unspecified dementia without behavioral disturbance: Secondary | ICD-10-CM | POA: Diagnosis not present

## 2014-03-31 DIAGNOSIS — G20 Parkinson's disease: Secondary | ICD-10-CM

## 2014-03-31 DIAGNOSIS — R269 Unspecified abnormalities of gait and mobility: Secondary | ICD-10-CM | POA: Diagnosis not present

## 2014-03-31 NOTE — Progress Notes (Signed)
Subjective:    Patient ID: Kelly Brady is a 80 y.o. female.  HPI     Interim history:   Kelly Brady is a very pleasant 80year-old right-handed woman who presents for followup consultation of her memory loss, gait disorder and mild parkinsonism. She is accompanied by her husband again today. I last saw her on 09/25/2013, at which time she reported that she had been tried on Namenda by her primary care physician a couple months prior but when she increase it to 14 mg once daily she had worsening of her balance and fell at home on the way to the kitchen. She hit the back of her head against a door and her husband took her to [facility] emergency room after taking her to an urgent care locally. She had a head CT which was negative. She had wrist pain on the right side with swelling. She also fell again about a week prior to her last visit. She hurt her right wrist again. She was reporting mild dyskinesias but was afraid to come off of carbidopa-levodopa or Mirapex. She was having hallucinations nearly daily. He started years ago, but after her diagnosis of parkinsonism. Over the course of time she was tried on Aricept, Exelon and Namenda and could not tolerate either one of these. At the last appointment I asked her to have a wrist x-ray through her primary care physician and I did not suggest any new memory medication at the time. Her memory numbers were fairly stable. She had not tried galantamine. I kept her Mirapex low-dose and Sinemet. I felt she may have mild parkinsonism but not idiopathic Parkinson's disease.  Today, her husband reports that she did have a right wrist fracture. She was not treated for it as it was already healing he states. She has fallen a couple of times, because of tripping over-the-counter. Or tripping over her clothes. She does not generally have a tendency to fall thankfully. Memory wise he noticed that she has getting worse. She still takes Sinemet 3  times a day at 50-200 milligrams, namely at 8 AM, noon and bedtime, and Mirapex 0.25 mg twice daily, namely at 5 PM and bedtime. He does note that she has involuntary movements. She does not like to participate in social activities at [employer]. He would like for her to enroll.  I saw her on 05/14/2013, at which time we talked about her medication trials. She was not able to tolerate Exelon patch and did not tolerate Aricept in the past. She had had a urinary tract infection and we talked about starting her on another memory medicine but mutually decided to hold off as she was still recuperating from a recent urinary tract infection and had been taken off of her blood pressure medication recently and we did not feel it was safe to rock the boat at the time. I saw her on 01/11/2013, at which time I felt that she had gait disorder and memory loss, an I re-initiated treatment for dementia. She had previously tried Aricept but had worsening hallucinations. I suggested a trial of Exelon patch. She had been tried off her Parkinson's medication but felt worse. 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 I asked her to use her cane more consistently.  In the interim, they called on 01/14/2013 reporting that she had more tremors and headaches after starting the new medication and they  were advised to stop the Exelon patch.  I first met her on 09/11/12, at which time she was reported significant daytime somnolence and I asked her to cut back on her medications because of her sleepiness and her dyskinesias. I suggested carbidopa-levodopa CR 50-200 mg strength one tablet 3 times a day, namely at 8 AM, 2 PM and 8 PM and pramipexole 0.25 mg only at bedtime, but she was still on bid when I saw her back. She was advised to use her cane or her walker for safety at all times.  She previously followed with Dr. Morene Antu and was  last seen by him on 05/14/2012, at which time Dr. Erling Cruz reduced her carbidopa-levodopa because of her hallucinations and dyskinesias.  She has an underlying medical history of migraines, anxiety, hypertension, arthritis, depression and hyperlipidemia. Her husband reported that she was rather sleepy during the day. She kicks in her sleep. She fell a few months ago in the bathroom without head injury or LOC. Prior to seeing Dr. Erling Cruz, she was followed by Dr. Claiborne Billings in [place] and Dr. Fayrene Helper with a history of difficulty walking, poor balance, with the absence of tremor. She has was tried on Requip generic and carbidopa-levodopa. She was seen by Dr. Jacqulynn Cadet at [facility] in 2009 who did not feel that she had Parkinson's disease and tapered her off her medications. She become worse and was restarted on medication by Dr. Humphrey Rolls in 2010. She had vertebroplasty d/t vertebral fractures. She also had lumbar spine fusion and March 2008. She was seen by Dr. Erling Cruz in February 2011, who felt that she had parkinsonism. She has a history of hallucinations and there was concern for LBD. She also had symptoms suggestive of RBD. She has had recurrent falls.  MRI of the brain with and without contrast in May 2007 showed subcortical and deep white matter chronic ischemic changes. MRI C-spine and May 2007 showed mild degenerative changes. MRI L-spine in May 2007 showed spondylolisthesis. Sensory evoked potentials, TSH, ACE level and ANA as well as ESR and vitamin B12 were normal in July 2011. Her vitamin D was low. Rheumatoid factor was increased. Anti-CCP was negative. She developed dyskinesias. She developed memory problems. In January 2014 her MMSE was 26, clock drawing was 4, animal fluency was 15. Her falls assessment tool score was 20.  She had hallucinations with Aricept.   Her Past Medical History Is Significant For: Past Medical History  Diagnosis Date  . Closed fracture of unspecified part of vertebral column without mention  of spinal cord injury   . Lumbago   . Rheumatoid arthritis(714.0)   . Unspecified vitamin D deficiency   . Disturbance of skin sensation   . Paralysis agitans   . Dementia without behavioral disturbance 01/11/2013    Her Past Surgical History Is Significant For: Past Surgical History  Procedure Laterality Date  . Lumbar spine surgery  2008  . Cataract extraction Right 2013  . Vertebroplasty  2011  . Melanoma excision Left 2013    arm    Her Family History Is Significant For: Family History  Problem Relation Age of Onset  . Kidney failure Mother   . Stroke Father     Her Social History Is Significant For: History   Social History  . Marital Status: Married    Spouse Name: [name]    Number of Children: 2  . Years of Education: College   Occupational History  . Retired    Social History Main Topics  .  Smoking status: Former Smoker    Types: Cigarettes    Quit date: 09/12/1962  . Smokeless tobacco: None  . Alcohol Use: No  . Drug Use: No  . Sexual Activity: None   Other Topics Concern  . None   Social History Narrative   Patient lives at home with spouse.   Coffee Use: 1-2 cups daily; sodas occasionally    Her Allergies Are:  Allergies  Allergen Reactions  . Latex Anaphylaxis  :   Her Current Medications Are:  Outpatient Encounter Prescriptions as of 03/31/2014  Medication Sig  . amitriptyline (ELAVIL) 10 MG tablet Take 10 mg by mouth at bedtime. 1/2 tablet daily  . CALCIUM PO Take 1 tablet by mouth 2 (two) times daily.   . carbidopa-levodopa (SINEMET CR) 50-200 MG per tablet Take 1 tablet by mouth 3 (three) times daily.   Marland Kitchen diltiazem (CARDIZEM CD) 120 MG 24 hr capsule Take 1 capsule by mouth daily.  Marland Kitchen escitalopram (LEXAPRO) 10 MG tablet Take 10 mg by mouth daily.  Marland Kitchen FLUZONE HIGH-DOSE 0.5 ML SUSY   . lansoprazole (PREVACID) 30 MG capsule Take 1 capsule by mouth daily.  . pramipexole (MIRAPEX) 0.25 MG tablet Take 0.25 mg by mouth 2 (two) times daily.   Marland Kitchen PREVIDENT 5000 DRY MOUTH 1.1 % GEL dental gel   . simvastatin (ZOCOR) 40 MG tablet Take 40 mg by mouth every evening.  . solifenacin (VESICARE) 10 MG tablet Take 10 mg by mouth daily.  Marland Kitchen triamcinolone cream (KENALOG) 0.1 %   :  Review of Systems:  Out of a complete 14 point review of systems, all are reviewed and negative with the exception of these symptoms as listed below:   Review of Systems  Neurological:       Memory loss,confusion    Objective:  Neurologic Exam  Physical Exam Physical Examination:   Filed Vitals:   03/31/14 1006  BP: 136/84  Pulse: 92  Temp: 97.5 F (36.4 C)   General Examination: The patient is a very pleasant 80 y.o. female in no acute distress.   HEENT: Normocephalic, atraumatic, pupils are equal, round and reactive to light and accommodation. She is status post cataract surgery in the right. Extraocular tracking shows mild saccadic breakdown without nystagmus noted. There is limitation to upper gaze. There is no significant decrease in eye blink rate. Hearing is intact. Face is symmetric with very minimal facial masking and normal facial sensation. There is no lip, neck or jaw tremor. Neck is not particularly rigid with intact passive ROM. There are no carotid bruits on auscultation. Oropharynx exam reveals moderate mouth dryness. No significant airway crowding is noted. Mallampati is class II. Tongue protrudes centrally and palate elevates symmetrically. There is no drooling.   Chest: is clear to auscultation without wheezing, rhonchi or crackles noted.  Heart: sounds are regular and normal without murmurs, rubs or gallops noted.   Abdomen: is soft, non-tender and non-distended with normal bowel sounds appreciated on auscultation.  Extremities: There is no pitting edema in the distal lower extremities bilaterally. Chronic hyperpigmentation is seen.   Skin: is warm and dry with no trophic changes noted. Age-related changes are noted on the skin.    Musculoskeletal: exam reveals no obvious joint deformities, tenderness, joint swelling or erythema, with the exception of R wrist bony prominence but no significant decrease in range of motion.   Neurologically:  Mental status: The patient is awake and alert, paying good attention. She is able to partially provide the  history. Her husband provides most of the history. She is oriented to: person, and situation, but her orientation is worse than last time. Her memory, attention, language and knowledge are impaired. There is a mild degree of bradyphrenia. Speech is mildly hypophonic with no dysarthria noted. Mood is congruent and affect is normal. She has mild LE dyskinesias.   On 01/11/13: Her MMSE was 23/30, her CDT was 2/4 today, her AFT was 12.   On 05/13/13: Her AFT was 16/minute.   On 09/25/13: MMSE: 24/30, CDT 2/4, AFT 11.   On 03/31/2014: MMSE 15/30, CDT: 1/4, AFT: 15/min  Cranial nerves are as described above under HEENT exam. In addition, shoulder shrug is normal with equal shoulder height noted.  Motor exam: Normal bulk, and strength for age is noted. There are no dyskinesias noted. Tone is very mildly rigid with absence of cogwheeling. There is overall mild bradykinesia. There is no drift or rebound.  There is no tremor. Romberg is not tested.   Reflexes are 1+ in the upper extremities and 1+ in the lower extremities.  Fine motor skills exam: Finger taps are mildly impaired on the right and mildly impaired on the left. Hand movements are mildly impaired on the right and mildly impaired on the left. RAP (rapid alternating patting) is mildly impaired on the right and mildly impaired on the left. Foot taps are mildly impaired on the right and mildly impaired on the left. Foot agility (in the form of heel stomping) is mildly impaired on the right and mildly impaired on the left.    Cerebellar testing shows no dysmetria or intention tremor on finger to nose testing. There is no truncal or  gait ataxia.   Sensory exam is intact to light touch.   Gait, station and balance: She stands up from the seated position with mild difficulty and does not need to push up with Her hands. She needs no assistance. No veering to one side is noted. She is not noted to lean to the side. Posture is moderately stooped, but could be age-appropriate. Stance is wide-based. She walks with decrease in stride length and pace, but fairly preserved arm swing b/l. She walks cautiously and has slight difficulty turning. Tandem walk is not possible. Balance is mildly impaired. She is not able to do a toe or heel stance. She did not bring her cane today.     Assessment and Plan:   In summary, Kelly Brady is a very pleasant 80 year old female with a history of  dementia without behavioral disturbance, gait disorder with recurrent falls, and mild parkinsonism, complicated by dyskinesias. Her memory scores have been worsening continuously. Unfortunately, she was not able to tolerate Exelon patch and did not tolerate Aricept in the past and most recently did not tolerate Namenda XR, per PCP's trial. in June she fell and broke her right wrist. Her husband is reluctant to try her on any new medication. She does not have a good track record of tolerating memory medicines. She has been on Sinemet for years. As far as her parkinsonism it is rather mild and I do not believe she has idiopathic Parkinson's disease. Nevertheless, she has  reportedly responded to Sinemet and  has been very reluctant to reduce the medication and her husband is lso reluctant to reduce it. Nevertheless, she has dyskinesias and I would like to see if we can gradually reduce it. To that end, I would like to reduce it to 2-1/2 pills from 3 pills. Therefore,  she will take 1 whole pill at 8 AM, half a pill at noon and 1 whole pill at bedtime. For now, we will keep the Mirapex at 0.25 mg twice daily which is at 5 PM and bedtime. I would like for her to  participate in social activities for memory loss patients at [facility]. I encouraged her to drink more fluid. In the past she was tried off levodopa therapy but felt worse. Again, she does not have telltale signs of idiopathic Parkinson's disease. 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 She has gait dysfunction, has fallen and injured herself and ia again advised to use her cane. I would like to see her back in 4 months from now, sooner if the need arises and have encouraged her husband to call with any updates, concerns or questions.

## 2014-03-31 NOTE — Patient Instructions (Signed)
You have dementia and it has become worse. Unfortunately, you were not able to tolerate memory medications, including Aricept, Exelon patch, and Namenda XR.  I do believe, you will benefit from participating in social activities for memory loss at [facility].  You have been on Sinemet 50/200 mg 1 pill 3 times a day. I would like to see if we can lower your dose some, as you have some involuntary movements, which we call dyskinesias. Therefore, take 1 pill in AM, 1/2 at noon, and 1 pill at night.

## 2014-04-29 DIAGNOSIS — G259 Extrapyramidal and movement disorder, unspecified: Secondary | ICD-10-CM | POA: Diagnosis not present

## 2014-04-29 DIAGNOSIS — N393 Stress incontinence (female) (male): Secondary | ICD-10-CM | POA: Diagnosis not present

## 2014-04-29 DIAGNOSIS — M810 Age-related osteoporosis without current pathological fracture: Secondary | ICD-10-CM | POA: Diagnosis not present

## 2014-04-29 DIAGNOSIS — G20 Parkinson's disease: Secondary | ICD-10-CM | POA: Diagnosis not present

## 2014-04-29 DIAGNOSIS — R262 Difficulty in walking, not elsewhere classified: Secondary | ICD-10-CM | POA: Diagnosis not present

## 2014-04-29 DIAGNOSIS — F329 Major depressive disorder, single episode, unspecified: Secondary | ICD-10-CM | POA: Diagnosis not present

## 2014-05-04 ENCOUNTER — Emergency Department: Payer: Self-pay | Admitting: Emergency Medicine

## 2014-05-04 DIAGNOSIS — R51 Headache: Secondary | ICD-10-CM | POA: Diagnosis not present

## 2014-05-04 DIAGNOSIS — M542 Cervicalgia: Secondary | ICD-10-CM | POA: Diagnosis not present

## 2014-05-04 DIAGNOSIS — S0990XA Unspecified injury of head, initial encounter: Secondary | ICD-10-CM | POA: Diagnosis not present

## 2014-05-04 DIAGNOSIS — F0390 Unspecified dementia without behavioral disturbance: Secondary | ICD-10-CM | POA: Diagnosis not present

## 2014-05-04 DIAGNOSIS — S199XXA Unspecified injury of neck, initial encounter: Secondary | ICD-10-CM | POA: Diagnosis not present

## 2014-05-04 DIAGNOSIS — G20 Parkinson's disease: Secondary | ICD-10-CM | POA: Diagnosis not present

## 2014-05-04 DIAGNOSIS — S0101XA Laceration without foreign body of scalp, initial encounter: Secondary | ICD-10-CM | POA: Diagnosis not present

## 2014-05-04 DIAGNOSIS — Z91040 Latex allergy status: Secondary | ICD-10-CM | POA: Diagnosis not present

## 2014-05-07 DIAGNOSIS — Z1231 Encounter for screening mammogram for malignant neoplasm of breast: Secondary | ICD-10-CM | POA: Diagnosis not present

## 2014-05-12 ENCOUNTER — Emergency Department: Payer: Self-pay | Admitting: Emergency Medicine

## 2014-05-12 DIAGNOSIS — Z4801 Encounter for change or removal of surgical wound dressing: Secondary | ICD-10-CM | POA: Diagnosis not present

## 2014-07-31 ENCOUNTER — Ambulatory Visit (INDEPENDENT_AMBULATORY_CARE_PROVIDER_SITE_OTHER): Payer: Medicare Other | Admitting: Neurology

## 2014-07-31 ENCOUNTER — Encounter: Payer: Self-pay | Admitting: Neurology

## 2014-07-31 VITALS — BP 138/78 | HR 90 | Resp 16 | Ht 61.0 in | Wt 156.0 lb

## 2014-07-31 DIAGNOSIS — G20 Parkinson's disease: Secondary | ICD-10-CM | POA: Diagnosis not present

## 2014-07-31 DIAGNOSIS — F0390 Unspecified dementia without behavioral disturbance: Secondary | ICD-10-CM | POA: Diagnosis not present

## 2014-07-31 DIAGNOSIS — R269 Unspecified abnormalities of gait and mobility: Secondary | ICD-10-CM

## 2014-07-31 DIAGNOSIS — G479 Sleep disorder, unspecified: Secondary | ICD-10-CM | POA: Diagnosis not present

## 2014-07-31 NOTE — Patient Instructions (Signed)
We will reduce your Sinemet CR 50/200 mg: 1/2 pill in AM, 1/2 at noon and 1 at bedtime. You are having hallucinations and abnormal involuntary movements, called dyskinesias.   You can try Melatonin at night for sleep: take 3 to 5 mg one to 2 hours before your bedtime. You can find this in the over the counter sleep aid aisle.

## 2014-07-31 NOTE — Progress Notes (Signed)
Subjective:    Patient ID: Kelly Brady is a 81 y.o. female.  HPI     Interim history:   Kelly Brady is a very pleasant 81year-old right-handed woman who presents for followup consultation of her memory loss, gait disorder and mild parkinsonism. She is accompanied by her husband again today. I last saw her on 03/31/2014, at which time her husband reported that she sustained a wrist fracture. She had fallen in June. She tripped over her clothes. Her memory was worsening. She had some dyskinesias and a reduced her Sinemet from 3 pills to 2-1/2 pills. I kept her on Mirapex low-dose. Her dyskinesias were relatively new. I encouraged her to participate in social activities at her retirement community. She had not tolerated Namenda long-acting, Aricept in the past or Exelon in the past.  Today, 07/31/2014: Her husband provides most of her history. She is able to answer some questions. She had fallen in December, this was when they had gone to [facility] in [place], [place] with her daughter and her family. She fell at night, out of bed, she was taken to [facility], needed 6 stiches to her R forehead. She is not using a cane or walker. She has an aid on Tuesday for 3 hours. They have a son in [place] and a daughter in [place]. She could not maneuver her walker or cane and has a tendency to lift them up, rather than walking with the walker or cane.    Previously:  I saw her on 09/25/2013, at which time she reported that she had been tried on Namenda by her primary care physician a couple months prior but when she increase it to 14 mg once daily she had worsening of her balance and fell at home on the way to the kitchen. She hit the back of her head against a door and her husband took her to [facility] emergency room after taking her to an urgent care locally. She had a head CT which was negative. She had wrist pain on the right side with swelling. She also fell again about a  week prior to her last visit. She hurt her right wrist again. She was reporting mild dyskinesias but was afraid to come off of carbidopa-levodopa or Mirapex. She was having hallucinations nearly daily. He started years ago, but after her diagnosis of parkinsonism. Over the course of time she was tried on Aricept, Exelon and Namenda and could not tolerate either one of these. At the last appointment I asked her to have a wrist x-ray through her primary care physician and I did not suggest any new memory medication at the time. Her memory numbers were fairly stable. She had not tried galantamine. I kept her Mirapex low-dose and Sinemet. I felt she may have mild parkinsonism but not idiopathic Parkinson's disease.   I saw her on 05/14/2013, at which time we talked about her medication trials. She was not able to tolerate Exelon patch and did not tolerate Aricept in the past. She had had a urinary tract infection and we talked about starting her on another memory medicine but mutually decided to hold off as she was still recuperating from a recent urinary tract infection and had been taken off of her blood pressure medication recently and we did not feel it was safe to rock the boat at the time. I saw her on 01/11/2013, at which time I felt that she had gait disorder and memory loss, an I re-initiated treatment  for dementia. She had previously tried Aricept but had worsening hallucinations. I suggested a trial of Exelon patch. She had been tried off her Parkinson's medication but felt worse. 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 I asked her to use her cane more consistently.   In the interim, they called on 01/14/2013 reporting that she had more tremors and headaches after starting the new medication and they were advised to stop the Exelon patch.   I first met her on 09/11/12, at which time she was reported  significant daytime somnolence and I asked her to cut back on her medications because of her sleepiness and her dyskinesias. I suggested carbidopa-levodopa CR 50-200 mg strength one tablet 3 times a day, namely at 8 AM, 2 PM and 8 PM and pramipexole 0.25 mg only at bedtime, but she was still on bid when I saw her back. She was advised to use her cane or her walker for safety at all times.   She previously followed with Dr. Morene Antu and was last seen by him on 05/14/2012, at which time Dr. Erling Cruz reduced her carbidopa-levodopa because of her hallucinations and dyskinesias.   She has an underlying medical history of migraines, anxiety, hypertension, arthritis, depression and hyperlipidemia. Her husband reported that she was rather sleepy during the day. She kicks in her sleep. She fell a few months ago in the bathroom without head injury or LOC. Prior to seeing Dr. Erling Cruz, she was followed by Dr. Claiborne Billings in [place] and Dr. Fayrene Helper with a history of difficulty walking, poor balance, with the absence of tremor. She has was tried on Requip generic and carbidopa-levodopa. She was seen by Dr. Jacqulynn Cadet at [facility] in 2009 who did not feel that she had Parkinson's disease and tapered her off her medications. She become worse and was restarted on medication by Dr. Humphrey Rolls in 2010. She had vertebroplasty d/t vertebral fractures. She also had lumbar spine fusion and March 2008. She was seen by Dr. Erling Cruz in February 2011, who felt that she had parkinsonism. She has a history of hallucinations and there was concern for LBD. She also had symptoms suggestive of RBD. She has had recurrent falls.   MRI of the brain with and without contrast in May 2007 showed subcortical and deep white matter chronic ischemic changes. MRI C-spine and May 2007 showed mild degenerative changes. MRI L-spine in May 2007 showed spondylolisthesis. Sensory evoked potentials, TSH, ACE level and ANA as well as ESR and vitamin B12 were normal in July 2011. Her  vitamin D was low. Rheumatoid factor was increased. Anti-CCP was negative. She developed dyskinesias. She developed memory problems. In January 2014 her MMSE was 26, clock drawing was 4, animal fluency was 15. Her falls assessment tool score was 20. She had hallucinations with Aricept.   Her Past Medical History Is Significant For: Past Medical History  Diagnosis Date  . Closed fracture of unspecified part of vertebral column without mention of spinal cord injury   . Lumbago   . Rheumatoid arthritis(714.0)   . Unspecified vitamin D deficiency   . Disturbance of skin sensation   . Paralysis agitans   . Dementia without behavioral disturbance 01/11/2013    Her Past Surgical History Is Significant For: Past Surgical History  Procedure Laterality Date  . Lumbar spine surgery  2008  . Cataract extraction Right 2013  . Vertebroplasty  2011  . Melanoma  excision Left 2013    arm    Her Family History Is Significant For: Family History  Problem Relation Age of Onset  . Kidney failure Mother   . Stroke Father     Her Social History Is Significant For: History   Social History  . Marital Status: Married    Spouse Name: Kelly Brady  . Number of Children: 2  . Years of Education: College   Occupational History  . Retired    Social History Main Topics  . Smoking status: Former Smoker    Types: Cigarettes    Quit date: 09/12/1962  . Smokeless tobacco: Not on file  . Alcohol Use: No  . Drug Use: No  . Sexual Activity: Not on file   Other Topics Concern  . None   Social History Narrative   Patient lives at home with spouse.   Coffee Use: 1-2 cups daily; sodas occasionally    Her Allergies Are:  Allergies  Allergen Reactions  . Latex Anaphylaxis  :   Her Current Medications Are:  Outpatient Encounter Prescriptions as of 07/31/2014  Medication Sig  . amitriptyline (ELAVIL) 10 MG tablet Take 10 mg by mouth at bedtime. 1/2 tablet daily  . CALCIUM PO Take 1 tablet by mouth 2  (two) times daily.   . carbidopa-levodopa (SINEMET CR) 50-200 MG per tablet Take 1 tablet by mouth 3 (three) times daily.   Marland Kitchen diltiazem (CARDIZEM CD) 120 MG 24 hr capsule Take 1 capsule by mouth daily.  Marland Kitchen escitalopram (LEXAPRO) 10 MG tablet Take 10 mg by mouth daily.  Marland Kitchen FLUZONE HIGH-DOSE 0.5 ML SUSY   . lansoprazole (PREVACID) 30 MG capsule Take 1 capsule by mouth daily.  . pramipexole (MIRAPEX) 0.25 MG tablet Take 0.25 mg by mouth 2 (two) times daily.  Marland Kitchen PREVIDENT 5000 DRY MOUTH 1.1 % GEL dental gel   . solifenacin (VESICARE) 10 MG tablet Take 10 mg by mouth daily.  . [DISCONTINUED] simvastatin (ZOCOR) 40 MG tablet Take 40 mg by mouth every evening.  . [DISCONTINUED] triamcinolone cream (KENALOG) 0.1 %   :  Review of Systems:  Out of a complete 14 point review of systems, all are reviewed and negative with the exception of these symptoms as listed below:   Review of Systems  Neurological:       Not sleeping well at night     Objective:  Neurologic Exam  Physical Exam Physical Examination:   Filed Vitals:   07/31/14 1524  BP: 138/78  Pulse: 90  Resp: 16   General Examination: The patient is a very pleasant 81 y.o. female in no acute distress.   HEENT: Normocephalic, atraumatic, pupils are equal, round and reactive to light and accommodation. She is status post cataract surgery in the right. Extraocular tracking shows mild saccadic breakdown without nystagmus noted. There is limitation to upper gaze. There is no significant decrease in eye blink rate. Hearing is intact. Face is symmetric with very minimal facial masking and normal facial sensation. There is no lip, neck or jaw tremor. Neck is not particularly rigid with intact passive ROM. There are no carotid bruits on auscultation. Oropharynx exam reveals moderate mouth dryness. No significant airway crowding is noted. Mallampati is class II. Tongue protrudes centrally and palate elevates symmetrically. There is no drooling.    Chest: is clear to auscultation without wheezing, rhonchi or crackles noted.  Heart: sounds are regular and normal without murmurs, rubs or gallops noted.   Abdomen: is soft, non-tender and non-distended  with normal bowel sounds appreciated on auscultation.  Extremities: There is no pitting edema in the distal lower extremities bilaterally. Chronic hyperpigmentation and redness are seen.   Skin: is warm and dry with changes see in the distal legs.    Musculoskeletal: exam reveals no obvious joint deformities, tenderness, joint swelling or erythema, with the exception of R wrist bony prominence but no significant decrease in range of motion.   Neurologically:  Mental status: The patient is awake and alert, paying good attention. She is able to partially provide the history. Her husband provides most of the history. She is oriented to: person, and situation, but her orientation is worse than last time. Her memory, attention, language and knowledge are impaired. There is a mild degree of bradyphrenia. Speech is mildly hypophonic with no dysarthria noted. Mood is congruent and affect is normal. She has mild LE dyskinesias.   On 01/11/13: Her MMSE was 23/30, her CDT was 2/4, her AFT was 12.   On 05/13/13: Her AFT was 16/minute.   On 09/25/13: MMSE: 24/30, CDT 2/4, AFT 11.   On 03/31/2014: MMSE 15/30, CDT: 1/4, AFT: 15/min  Cranial nerves are as described above under HEENT exam. In addition, shoulder shrug is normal with equal shoulder height noted.  Motor exam: Normal bulk, and strength for age is noted. There are no dyskinesias noted. Tone is very mildly rigid with absence of cogwheeling. There is overall mild bradykinesia. There is no drift or rebound.  There is no tremor. Romberg is not possible for instability noted.   Reflexes are 1+ in the upper extremities and 1+ in the lower extremities.  Fine motor skills exam: Finger taps are mildly impaired on the right and mildly impaired on the  left. Hand movements are mildly impaired on the right and mildly impaired on the left. RAP (rapid alternating patting) is mildly impaired on the right and mildly impaired on the left. Foot taps are mildly impaired on the right and mildly impaired on the left. Foot agility (in the form of heel stomping) is mildly impaired on the right and mildly impaired on the left.    Cerebellar testing shows no dysmetria or intention tremor on finger to nose testing. There is no truncal or gait ataxia.   Sensory exam is intact to light touch.   Gait, station and balance: She stands up from the seated position with mild difficulty and does not need to push up with Her hands. She needs no assistance. No veering to one side is noted. She is not noted to lean to the side. Posture is moderately stooped. Stance is wide-based. She walks with decrease in stride length and pace, but fairly preserved arm swing b/l. She walks cautiously and has slight difficulty turning. Tandem walk is not possible due to balance being impaired.   Assessment and Plan:   In summary, JALASIA ESKRIDGE is a very pleasant 81 year old female with a history of dementia without behavioral disturbance, gait disorder with recurrent falls, and mild parkinsonism, complicated by dyskinesias and now also more hallucinations as well as sleep disturbance. Her memory scores have declined with time. Unfortunately, she was not able to tolerate Exelon patch and did not tolerate Aricept in the past and did not tolerate Namenda XR, per PCP's trial. In June 2015 she fell and broke her right wrist. Her husband  has understandably been reluctant to try her on new medications for memory. She clearly does not have a good track record of being able to  tolerate medications. For sleep, she was advised to increase amitriptyline to 10 mg for primary care physician. She has had more hallucinations. This could be a result from the amitriptyline however it is a small dose. At this  juncture, I am not sure that she is getting a lot of benefit from the Sinemet. In and of itself the Sinemet can cause sleepiness in the daytime and sometimes hallucinations. Therefore I suggested they reduce it to half a pill in the morning and half a pill at midday and keep a whole pill at night. Furthermore, they are advised to try melatonin at night for sleep which is over-the-counter, 3-5 mg, 1-2 hours before projected bedtime. She is advised to be cautious when walking and is encouraged to use a walker or cane but she has not been maneuvering these well according to her husband. She does not drive. She no longer cooks. Both children are local and the children and their families look after them. Her husband does most of the cooking on a day-to-day basis but their children help out with providing food and taking them out to eat or eating together at their homes. Nevertheless, she has dyskinesias and I would like to see if we can further  reduce her Sinemet. She will continue on the Mirapex 0.25 mg twice daily for now. I would like to see her back in 6 months from now, sooner if the need arises and have encouraged her husband to call with any updates, concerns or questions.  I spent 25 minutes in total face-to-face time with the patient, more than 50% of which was spent in counseling and coordination of care, reviewing test results, reviewing medication and discussing or reviewing the diagnosis of parkinsonism, memory loss, gait disorder and sleep disturbance, the prognosis and treatment options.

## 2014-08-04 ENCOUNTER — Emergency Department: Admit: 2014-08-04 | Disposition: A | Discharge status: Home / Self Care | Payer: Self-pay | Admitting: Student

## 2014-08-04 DIAGNOSIS — S62356A Nondisplaced fracture of shaft of fifth metacarpal bone, right hand, initial encounter for closed fracture: Secondary | ICD-10-CM | POA: Diagnosis not present

## 2014-08-04 DIAGNOSIS — S62244A Nondisplaced fracture of shaft of first metacarpal bone, right hand, initial encounter for closed fracture: Secondary | ICD-10-CM | POA: Diagnosis not present

## 2014-08-04 DIAGNOSIS — Z91040 Latex allergy status: Secondary | ICD-10-CM | POA: Diagnosis not present

## 2014-08-04 DIAGNOSIS — I10 Essential (primary) hypertension: Secondary | ICD-10-CM | POA: Diagnosis not present

## 2014-08-04 DIAGNOSIS — S63511A Sprain of carpal joint of right wrist, initial encounter: Secondary | ICD-10-CM | POA: Diagnosis not present

## 2014-08-07 DIAGNOSIS — R2689 Other abnormalities of gait and mobility: Secondary | ICD-10-CM | POA: Diagnosis not present

## 2014-08-07 DIAGNOSIS — S62350A Nondisplaced fracture of shaft of second metacarpal bone, right hand, initial encounter for closed fracture: Secondary | ICD-10-CM | POA: Diagnosis not present

## 2014-08-10 NOTE — Op Note (Signed)
PATIENT NAME:  Kelly Brady, Kelly Brady MR#:  [id number] DATE OF BIRTH:  March 23, 1931  DATE OF PROCEDURE:  09/09/2011  PREOPERATIVE DIAGNOSIS:  Cataract, right eye.   POSTOPERATIVE DIAGNOSIS:  Cataract, right eye.  PROCEDURE PERFORMED:  Extracapsular cataract extraction using phacoemulsification with placement of an Alcon SN6CWS, 20.0-diopter posterior chamber lens, serial # [id number].  SURGEON:  Loura Back. Aniyiah Zell, MD  ASSISTANT:  None.  ANESTHESIA:  4% lidocaine and 0.75% Marcaine in a 50/50 mixture with 10 units per mL of Hylenex added, given as a peribulbar.  ANESTHESIOLOGIST:  Dr. Marcello Moores.  COMPLICATIONS:  None.  ESTIMATED BLOOD LOSS:  Less than 1 ml.  DESCRIPTION OF PROCEDURE:  The patient was brought to the operating room and given a peribulbar block.  The patient was then prepped and draped in the usual fashion.  The vertical rectus muscles were imbricated using 5-0 silk sutures.  These sutures were then clamped to the sterile drapes as bridle sutures.  A limbal peritomy was performed extending two clock hours and hemostasis was obtained with cautery.  A partial thickness scleral groove was made at the surgical limbus and dissected anteriorly in a lamellar dissection using an Alcon crescent knife.  The anterior chamber was entered superonasally with a Superblade and through the lamellar dissection with a 2.6 mm keratome.  DisCoVisc was used to replace the aqueous and a continuous tear capsulorrhexis was carried out.  Hydrodissection and hydrodelineation were carried out with balanced salt and a 27 gauge canula.  The nucleus was rotated to confirm the effectiveness of the hydrodissection.  Phacoemulsification was carried out using a divide-and-conquer technique.  Total ultrasound time was 1 minute and 25 seconds with an average power of 15.3 percent. CDE of 21.73.  Irrigation/aspiration was used to remove the residual cortex.  DisCoVisc was used to inflate the capsule and the internal  incision was enlarged to 3 mm with the crescent knife.  The intraocular lens was folded and inserted into the capsular bag using the AcrySert delivery system.  Irrigation/aspiration was used to remove the residual DisCoVisc.  Miostat was injected into the anterior chamber through the paracentesis track to inflate the anterior chamber and induce miosis.  The wound was checked for leaks and none were found. The conjunctiva was closed with cautery and the bridle sutures were removed.  Two drops of 0.3% Vigamox were placed on the eye.   An eye shield was placed on the eye.  The patient was discharged to the recovery room in good condition. ____________________________ Loura Back Kirk Basquez, MD sad:rbg D: 09/09/2011 12:03:28 ET T: 09/09/2011 13:44:13 ET JOB#: [id number]  cc: Remo Lipps A. Jyden Kromer, MD, <Dictator> Martie Lee MD ELECTRONICALLY SIGNED 09/12/2011 13:08

## 2014-08-21 DIAGNOSIS — S62350A Nondisplaced fracture of shaft of second metacarpal bone, right hand, initial encounter for closed fracture: Secondary | ICD-10-CM | POA: Diagnosis not present

## 2014-08-26 DIAGNOSIS — M79641 Pain in right hand: Secondary | ICD-10-CM | POA: Diagnosis not present

## 2014-08-26 DIAGNOSIS — W01190D Fall on same level from slipping, tripping and stumbling with subsequent striking against furniture, subsequent encounter: Secondary | ICD-10-CM | POA: Diagnosis not present

## 2014-08-26 DIAGNOSIS — R262 Difficulty in walking, not elsewhere classified: Secondary | ICD-10-CM | POA: Diagnosis not present

## 2014-08-26 DIAGNOSIS — G20 Parkinson's disease: Secondary | ICD-10-CM | POA: Diagnosis not present

## 2014-08-26 DIAGNOSIS — F329 Major depressive disorder, single episode, unspecified: Secondary | ICD-10-CM | POA: Diagnosis not present

## 2014-09-08 ENCOUNTER — Emergency Department
Admission: EM | Admit: 2014-09-08 | Discharge: 2014-09-08 | Disposition: A | Discharge status: Home / Self Care | Payer: Medicare Other | Attending: Emergency Medicine | Admitting: Emergency Medicine

## 2014-09-08 DIAGNOSIS — Z87891 Personal history of nicotine dependence: Secondary | ICD-10-CM | POA: Insufficient documentation

## 2014-09-08 DIAGNOSIS — Y998 Other external cause status: Secondary | ICD-10-CM | POA: Insufficient documentation

## 2014-09-08 DIAGNOSIS — S0181XA Laceration without foreign body of other part of head, initial encounter: Secondary | ICD-10-CM

## 2014-09-08 DIAGNOSIS — Z79899 Other long term (current) drug therapy: Secondary | ICD-10-CM | POA: Diagnosis not present

## 2014-09-08 DIAGNOSIS — W010XXA Fall on same level from slipping, tripping and stumbling without subsequent striking against object, initial encounter: Secondary | ICD-10-CM | POA: Diagnosis not present

## 2014-09-08 DIAGNOSIS — Y9389 Activity, other specified: Secondary | ICD-10-CM | POA: Diagnosis not present

## 2014-09-08 DIAGNOSIS — Z91040 Latex allergy status: Secondary | ICD-10-CM | POA: Diagnosis not present

## 2014-09-08 DIAGNOSIS — S01111A Laceration without foreign body of right eyelid and periocular area, initial encounter: Secondary | ICD-10-CM | POA: Insufficient documentation

## 2014-09-08 DIAGNOSIS — Y92009 Unspecified place in unspecified non-institutional (private) residence as the place of occurrence of the external cause: Secondary | ICD-10-CM | POA: Diagnosis not present

## 2014-09-08 MED ORDER — LIDOCAINE-EPINEPHRINE (PF) 1 %-1:200000 IJ SOLN
INTRAMUSCULAR | Status: AC
  Filled 2014-09-08: qty 30

## 2014-09-08 NOTE — ED Notes (Signed)
Pt here with her husband who states the pt lost her balance and fell hitting her head on the concrete porch at home..pt has a lac above the right eye..husband states she falls a lot due to parkinson's disease.Marland Kitchen

## 2014-09-08 NOTE — ED Provider Notes (Signed)
[facility] Emergency Department Provider Note  ____________________________________________  Time seen: Approximately 3:00 PM  I have reviewed the triage vital signs and the nursing notes.   HISTORY  Chief Complaint Fall and Facial Laceration    HPI Kelly Brady is a 81 y.o. female presents to the emergency department after having an mechanical, non-syncopal fall at home while trying to sleep reports. She states she tripped and fell onto the concrete. She was wearing glasses and believes that that how she cut her face. She denies taking an aspirin daily or any blood thinners. Her husband reports that she has been acting herself since the incident.   Past Medical History  Diagnosis Date  . Closed fracture of unspecified part of vertebral column without mention of spinal cord injury   . Lumbago   . Rheumatoid arthritis(714.0)   . Unspecified vitamin D deficiency   . Disturbance of skin sensation   . Paralysis agitans   . Dementia without behavioral disturbance 01/11/2013    Patient Active Problem List   Diagnosis Date Noted  . Dementia without behavioral disturbance 01/11/2013    Past Surgical History  Procedure Laterality Date  . Lumbar spine surgery  2008  . Cataract extraction Right 2013  . Vertebroplasty  2011  . Melanoma excision Left 2013    arm    Current Outpatient Rx  Name  Route  Sig  Dispense  Refill  . amitriptyline (ELAVIL) 10 MG tablet   Oral   Take 10 mg by mouth at bedtime. 1/2 tablet daily         . CALCIUM PO   Oral   Take 1 tablet by mouth 2 (two) times daily.          . carbidopa-levodopa (SINEMET CR) 50-200 MG per tablet   Oral   Take 1 tablet by mouth 3 (three) times daily.          Marland Kitchen diltiazem (CARDIZEM CD) 120 MG 24 hr capsule   Oral   Take 1 capsule by mouth daily.         Marland Kitchen escitalopram (LEXAPRO) 10 MG tablet   Oral   Take 10 mg by mouth daily.         Marland Kitchen FLUZONE HIGH-DOSE 0.5 ML SUSY             0   . lansoprazole (PREVACID) 30 MG capsule   Oral   Take 1 capsule by mouth daily.         . pramipexole (MIRAPEX) 0.25 MG tablet   Oral   Take 0.25 mg by mouth 2 (two) times daily.         Marland Kitchen PREVIDENT 5000 DRY MOUTH 1.1 % GEL dental gel               . solifenacin (VESICARE) 10 MG tablet   Oral   Take 10 mg by mouth daily.           Allergies Latex  Family History  Problem Relation Age of Onset  . Kidney failure Mother   . Stroke Father     Social History History  Substance Use Topics  . Smoking status: Former Smoker    Types: Cigarettes    Quit date: 09/12/1962  . Smokeless tobacco: Not on file  . Alcohol Use: No    Review of Systems Constitutional: No fever/chills Eyes: No visual changes. ENT: No sore throat. Cardiovascular: Denies chest pain. Respiratory: Denies shortness of breath. Gastrointestinal: No abdominal  pain.  No nausea, no vomiting.  No diarrhea.  No constipation. Genitourinary: Negative for dysuria. Musculoskeletal: Negative for back pain. Skin: Negative for rash. Neurological: Negative for headaches, focal weakness or numbness. At baseline.  10-point ROS otherwise negative.  ____________________________________________   PHYSICAL EXAM:  VITAL SIGNS: ED Triage Vitals  Enc Vitals Group     BP 09/08/14 1331 134/64 mmHg     Pulse Rate 09/08/14 1331 90     Resp 09/08/14 1331 16     Temp 09/08/14 1331 97.4 F (36.3 C)     Temp Source 09/08/14 1331 Oral     SpO2 09/08/14 1331 99 %     Weight 09/08/14 1331 145 lb (65.772 kg)     Height 09/08/14 1331 5\' 1"  (1.549 m)     Head Cir --      Peak Flow --      Pain Score 09/08/14 1331 2     Pain Loc --      Pain Edu? --      Excl. in [place]? --     Constitutional: Alert and oriented.  Eyes: Conjunctivae are normal. PERRL. EOMI. Head: Laceration over right eyebrow. Nose: No congestion/rhinnorhea. Mouth/Throat: Mucous membranes are moist.  Oropharynx  non-erythematous. Neck: No stridor.  No cervical spine tenderness to palpation. Cardiovascular: Normal rate, regular rhythm. Grossly normal heart sounds.  Good peripheral circulation. Respiratory: Normal respiratory effort.  No retractions. Lungs CTAB. Gastrointestinal: Soft and nontender. No distention. No abdominal bruits. No CVA tenderness. Musculoskeletal: No lower extremity tenderness nor edema.  No joint effusions. Neurologic:  Normal speech and language. No gross focal neurologic deficits are appreciated. Speech is normal. Skin:  2 cm laceration present to right eyebrow Psychiatric: Mood and affect are normal. Speech and behavior are at baseline.  ____________________________________________   LABS (all labs ordered are listed, but only abnormal results are displayed)  Labs Reviewed - No data to display ____________________________________________  EKG   ____________________________________________  RADIOLOGY  Not indicated--no LOC, not on blood thinners, neuro at baseline ____________________________________________   PROCEDURES  Procedure(s) performed: LACERATION REPAIR Performed by: Sherrie George Authorized by: Sherrie George Consent: Verbal consent obtained. Risks and benefits: risks, benefits and alternatives were discussed Consent given by: patient Patient identity confirmed: provided demographic data Prepped and Draped in normal sterile fashion Wound explored  Laceration Location: just above right eyebrow  Laceration Length: 2 cm  No Foreign Bodies seen or palpated  Anesthesia: local infiltration  Local anesthetic: lidocaine 1% with epinephrine  Anesthetic total: 2 ml  Irrigation method: syringe Amount of cleaning: standard  Skin closure: single layer  Number of sutures: 4  Technique: simple interrupted  Patient tolerance: Patient tolerated the procedure well with no immediate complications.    Critical Care performed:  No  ____________________________________________   INITIAL IMPRESSION / ASSESSMENT AND PLAN / ED COURSE  Pertinent labs & imaging results that were available during my care of the patient were reviewed by me and considered in my medical decision making (see chart for details).  Husband was encouraged to bring Ms Sonnenfeld back to the ER for any concerns. Sutures should be removed in 5-7 days.  ____________________________________________   FINAL CLINICAL IMPRESSION(S) / ED DIAGNOSES  Final diagnoses:  Laceration of face without complication, initial encounter      Victorino Dike, FNP 09/08/14 1603  Hinda Kehr, MD 09/09/14 [phone]

## 2014-09-08 NOTE — Discharge Instructions (Signed)
Facial Laceration  A facial laceration is a cut on the face. These injuries can be painful and cause bleeding. Lacerations usually heal quickly, but they need special care to reduce scarring. DIAGNOSIS  Your health care provider will take a medical history, ask for details about how the injury occurred, and examine the wound to determine how deep the cut is. TREATMENT  Some facial lacerations may not require closure. Others may not be able to be closed because of an increased risk of infection. The risk of infection and the chance for successful closure will depend on various factors, including the amount of time since the injury occurred. The wound may be cleaned to help prevent infection. If closure is appropriate, pain medicines may be given if needed. Your health care provider will use stitches (sutures), wound glue (adhesive), or skin adhesive strips to repair the laceration. These tools bring the skin edges together to allow for faster healing and a better cosmetic outcome. If needed, you may also be given a tetanus shot. HOME CARE INSTRUCTIONS  Only take over-the-counter or prescription medicines as directed by your health care provider.  Follow your health care provider's instructions for wound care. These instructions will vary depending on the technique used for closing the wound. For Sutures:  Keep the wound clean and dry.   If you were given a bandage (dressing), you should change it at least once a day. Also change the dressing if it becomes wet or dirty, or as directed by your health care provider.   Wash the wound with soap and water 2 times a day. Rinse the wound off with water to remove all soap. Pat the wound dry with a clean towel.   After cleaning, apply a thin layer of the antibiotic ointment recommended by your health care provider. This will help prevent infection and keep the dressing from sticking.   You may shower as usual after the first 24 hours. Do not soak the  wound in water until the sutures are removed.   Get your sutures removed as directed by your health care provider. With facial lacerations, sutures should usually be taken out after 4-5 days to avoid stitch marks.   Wait a few days after your sutures are removed before applying any makeup. For Skin Adhesive Strips:  Keep the wound clean and dry.   Do not get the skin adhesive strips wet. You may bathe carefully, using caution to keep the wound dry.   If the wound gets wet, pat it dry with a clean towel.   Skin adhesive strips will fall off on their own. You may trim the strips as the wound heals. Do not remove skin adhesive strips that are still stuck to the wound. They will fall off in time.  For Wound Adhesive:  You may briefly wet your wound in the shower or bath. Do not soak or scrub the wound. Do not swim. Avoid periods of heavy sweating until the skin adhesive has fallen off on its own. After showering or bathing, gently pat the wound dry with a clean towel.   Do not apply liquid medicine, cream medicine, ointment medicine, or makeup to your wound while the skin adhesive is in place. This may loosen the film before your wound is healed.   If a dressing is placed over the wound, be careful not to apply tape directly over the skin adhesive. This may cause the adhesive to be pulled off before the wound is healed.   Avoid   prolonged exposure to sunlight or tanning lamps while the skin adhesive is in place.  The skin adhesive will usually remain in place for 5-10 days, then naturally fall off the skin. Do not pick at the adhesive film.  After Healing: Once the wound has healed, cover the wound with sunscreen during the day for 1 full year. This can help minimize scarring. Exposure to ultraviolet light in the first year will darken the scar. It can take 1-2 years for the scar to lose its redness and to heal completely.  SEEK IMMEDIATE MEDICAL CARE IF:  You have redness, pain, or  swelling around the wound.   You see ayellowish-white fluid (pus) coming from the wound.   You have chills or a fever.  MAKE SURE YOU:  Understand these instructions.  Will watch your condition.  Will get help right away if you are not doing well or get worse. Document Released: 05/12/2004 Document Revised: 01/23/2013 Document Reviewed: 11/15/2012 Exit[employer] Patient Information 2015 [employer]. This information is not intended to replace advice given to you by your health care provider. Make sure you discuss any questions you have with your health care provider.  

## 2014-09-11 DIAGNOSIS — S62350D Nondisplaced fracture of shaft of second metacarpal bone, right hand, subsequent encounter for fracture with routine healing: Secondary | ICD-10-CM | POA: Diagnosis not present

## 2014-09-17 ENCOUNTER — Encounter: Payer: Self-pay | Admitting: Emergency Medicine

## 2014-09-17 ENCOUNTER — Emergency Department
Admission: EM | Admit: 2014-09-17 | Discharge: 2014-09-17 | Disposition: A | Discharge status: Home / Self Care | Payer: 59 | Attending: Emergency Medicine | Admitting: Emergency Medicine

## 2014-09-17 DIAGNOSIS — Z79899 Other long term (current) drug therapy: Secondary | ICD-10-CM | POA: Diagnosis not present

## 2014-09-17 DIAGNOSIS — Z91040 Latex allergy status: Secondary | ICD-10-CM | POA: Diagnosis not present

## 2014-09-17 DIAGNOSIS — Z4802 Encounter for removal of sutures: Secondary | ICD-10-CM | POA: Diagnosis not present

## 2014-09-17 DIAGNOSIS — Z87891 Personal history of nicotine dependence: Secondary | ICD-10-CM | POA: Diagnosis not present

## 2014-09-17 NOTE — Discharge Instructions (Signed)

## 2014-09-17 NOTE — ED Provider Notes (Signed)
[facility] Emergency Department Provider Note  ____________________________________________  Time seen: 10:00 I have reviewed the triage vital signs and the nursing notes.   HISTORY  Chief Complaint Suture / Staple Removal   HPI Kelly Brady is a 81 y.o. female returns today to have sutures removed from a right sided forehead laceration from 5/23.Denies any problems   Past Medical History  Diagnosis Date  . Closed fracture of unspecified part of vertebral column without mention of spinal cord injury   . Lumbago   . Rheumatoid arthritis(714.0)   . Unspecified vitamin D deficiency   . Disturbance of skin sensation   . Paralysis agitans   . Dementia without behavioral disturbance 01/11/2013    Patient Active Problem List   Diagnosis Date Noted  . Dementia without behavioral disturbance 01/11/2013    Past Surgical History  Procedure Laterality Date  . Lumbar spine surgery  2008  . Cataract extraction Right 2013  . Vertebroplasty  2011  . Melanoma excision Left 2013    arm    Current Outpatient Rx  Name  Route  Sig  Dispense  Refill  . amitriptyline (ELAVIL) 10 MG tablet   Oral   Take 10 mg by mouth at bedtime. 1/2 tablet daily         . CALCIUM PO   Oral   Take 1 tablet by mouth 2 (two) times daily.          . carbidopa-levodopa (SINEMET CR) 50-200 MG per tablet   Oral   Take 1 tablet by mouth 3 (three) times daily.          Marland Kitchen diltiazem (CARDIZEM CD) 120 MG 24 hr capsule   Oral   Take 1 capsule by mouth daily.         Marland Kitchen escitalopram (LEXAPRO) 10 MG tablet   Oral   Take 10 mg by mouth daily.         Marland Kitchen FLUZONE HIGH-DOSE 0.5 ML SUSY            0   . lansoprazole (PREVACID) 30 MG capsule   Oral   Take 1 capsule by mouth daily.         . pramipexole (MIRAPEX) 0.25 MG tablet   Oral   Take 0.25 mg by mouth 2 (two) times daily.         Marland Kitchen PREVIDENT 5000 DRY MOUTH 1.1 % GEL dental gel               .  solifenacin (VESICARE) 10 MG tablet   Oral   Take 10 mg by mouth daily.           Allergies Latex  Family History  Problem Relation Age of Onset  . Kidney failure Mother   . Stroke Father     Social History History  Substance Use Topics  . Smoking status: Former Smoker    Types: Cigarettes    Quit date: 09/12/1962  . Smokeless tobacco: Not on file  . Alcohol Use: No    Review of Systems Constitutional: No fever/chills Eyes: No visual changes. Skin: Negative for rash. Neurological: Negative for headaches, focal weakness or numbness.  10-point ROS otherwise negative.  ____________________________________________   PHYSICAL EXAM:  VITAL SIGNS: ED Triage Vitals  Enc Vitals Group     BP 09/17/14 0934 137/78 mmHg     Pulse Rate 09/17/14 0934 82     Resp 09/17/14 0934 18     Temp 09/17/14 0934  98 F (36.7 C)     Temp Source 09/17/14 0934 Oral     SpO2 09/17/14 0934 98 %     Weight 09/17/14 0934 150 lb (68.04 kg)     Height 09/17/14 0934 5\' 2"  (1.575 m)     Head Cir --      Peak Flow --      Pain Score 09/17/14 0936 0     Pain Loc --      Pain Edu? --      Excl. in [place]? --     Constitutional: Alert and oriented. Well appearing and in no acute distress. Eyes: Conjunctivae are normal. PERRL. EOMI. Head: Atraumatic. Nose: No congestion/rhinnorhea. Neck: No stridor.   Cardiovascular: Normal rate, regular rhythm. Grossly normal heart sounds.  Good peripheral circulation. Respiratory: Normal respiratory effort.  No retractions Neurologic:  Normal speech and language. No gross focal neurologic deficits are appreciated. Speech is normal. No gait instability. Skin:  Skin is warm, dry and intact. No infection noted at suture site Psychiatric: Mood and affect are normal. Speech and behavior are normal.  ____________________________________________   LABS (all labs ordered are listed, but only abnormal results are displayed)  Labs Reviewed - No data to  display ____________________________________________   PROCEDURES  Procedure(s) performed: Sutures were removed by the RN  Critical Care performed: No  ____________________________________________   INITIAL IMPRESSION / ASSESSMENT AND PLAN / ED COURSE  Pertinent labs & imaging results that were available during my care of the patient were reviewed by me and considered in my medical decision making (see chart for details).  Patient is to return if any problems, reassured that suture site looks very good ____________________________________________   FINAL CLINICAL IMPRESSION(S) / ED DIAGNOSES  Final diagnoses:  Encounter for removal of sutures      Kelly Hai, PA-C 09/17/14 1554

## 2014-09-17 NOTE — ED Notes (Signed)
Here for suture removal

## 2014-10-02 DIAGNOSIS — Z022 Encounter for examination for admission to residential institution: Secondary | ICD-10-CM | POA: Diagnosis not present

## 2014-10-02 DIAGNOSIS — I10 Essential (primary) hypertension: Secondary | ICD-10-CM | POA: Diagnosis not present

## 2014-10-02 DIAGNOSIS — G20 Parkinson's disease: Secondary | ICD-10-CM | POA: Diagnosis not present

## 2014-10-02 DIAGNOSIS — N393 Stress incontinence (female) (male): Secondary | ICD-10-CM | POA: Diagnosis not present

## 2014-10-02 DIAGNOSIS — F329 Major depressive disorder, single episode, unspecified: Secondary | ICD-10-CM | POA: Diagnosis not present

## 2014-10-13 DIAGNOSIS — Z111 Encounter for screening for respiratory tuberculosis: Secondary | ICD-10-CM | POA: Diagnosis not present

## 2014-10-21 DIAGNOSIS — Z111 Encounter for screening for respiratory tuberculosis: Secondary | ICD-10-CM | POA: Diagnosis not present

## 2014-11-24 DIAGNOSIS — G20 Parkinson's disease: Secondary | ICD-10-CM | POA: Diagnosis not present

## 2014-11-24 DIAGNOSIS — N390 Urinary tract infection, site not specified: Secondary | ICD-10-CM | POA: Diagnosis not present

## 2014-11-24 DIAGNOSIS — I10 Essential (primary) hypertension: Secondary | ICD-10-CM | POA: Diagnosis not present

## 2014-12-23 DIAGNOSIS — I10 Essential (primary) hypertension: Secondary | ICD-10-CM | POA: Diagnosis not present

## 2014-12-23 DIAGNOSIS — G20 Parkinson's disease: Secondary | ICD-10-CM | POA: Diagnosis not present

## 2014-12-23 DIAGNOSIS — N390 Urinary tract infection, site not specified: Secondary | ICD-10-CM | POA: Diagnosis not present

## 2014-12-23 DIAGNOSIS — G4701 Insomnia due to medical condition: Secondary | ICD-10-CM | POA: Diagnosis not present

## 2015-01-29 ENCOUNTER — Ambulatory Visit: Payer: Self-pay | Admitting: Neurology

## 2015-02-17 DIAGNOSIS — Z0001 Encounter for general adult medical examination with abnormal findings: Secondary | ICD-10-CM | POA: Diagnosis not present

## 2015-02-17 DIAGNOSIS — N390 Urinary tract infection, site not specified: Secondary | ICD-10-CM | POA: Diagnosis not present

## 2015-02-17 DIAGNOSIS — G20 Parkinson's disease: Secondary | ICD-10-CM | POA: Diagnosis not present

## 2015-02-17 DIAGNOSIS — G4701 Insomnia due to medical condition: Secondary | ICD-10-CM | POA: Diagnosis not present

## 2015-03-04 ENCOUNTER — Encounter: Payer: Self-pay | Admitting: Emergency Medicine

## 2015-03-04 ENCOUNTER — Emergency Department: Payer: Medicare Other

## 2015-03-04 ENCOUNTER — Emergency Department
Admission: EM | Admit: 2015-03-04 | Discharge: 2015-03-05 | Disposition: A | Discharge status: Home / Self Care | Payer: Medicare Other | Attending: Emergency Medicine | Admitting: Emergency Medicine

## 2015-03-04 DIAGNOSIS — S0990XA Unspecified injury of head, initial encounter: Secondary | ICD-10-CM | POA: Diagnosis not present

## 2015-03-04 DIAGNOSIS — J90 Pleural effusion, not elsewhere classified: Secondary | ICD-10-CM | POA: Diagnosis not present

## 2015-03-04 DIAGNOSIS — W01198A Fall on same level from slipping, tripping and stumbling with subsequent striking against other object, initial encounter: Secondary | ICD-10-CM | POA: Diagnosis not present

## 2015-03-04 DIAGNOSIS — Z91040 Latex allergy status: Secondary | ICD-10-CM | POA: Insufficient documentation

## 2015-03-04 DIAGNOSIS — Z87891 Personal history of nicotine dependence: Secondary | ICD-10-CM | POA: Insufficient documentation

## 2015-03-04 DIAGNOSIS — Y998 Other external cause status: Secondary | ICD-10-CM | POA: Insufficient documentation

## 2015-03-04 DIAGNOSIS — Y9389 Activity, other specified: Secondary | ICD-10-CM | POA: Diagnosis not present

## 2015-03-04 DIAGNOSIS — S0081XA Abrasion of other part of head, initial encounter: Secondary | ICD-10-CM | POA: Insufficient documentation

## 2015-03-04 DIAGNOSIS — Z79899 Other long term (current) drug therapy: Secondary | ICD-10-CM | POA: Insufficient documentation

## 2015-03-04 DIAGNOSIS — Y9289 Other specified places as the place of occurrence of the external cause: Secondary | ICD-10-CM | POA: Insufficient documentation

## 2015-03-04 DIAGNOSIS — R531 Weakness: Secondary | ICD-10-CM | POA: Diagnosis not present

## 2015-03-04 LAB — COMPREHENSIVE METABOLIC PANEL
ALBUMIN: 4.2 g/dL (ref 3.5–5.0)
ALT: 6 U/L — ABNORMAL LOW (ref 14–54)
AST: 28 U/L (ref 15–41)
Alkaline Phosphatase: 78 U/L (ref 38–126)
Anion gap: 9 (ref 5–15)
BUN: 11 mg/dL (ref 6–20)
CO2: 24 mmol/L (ref 22–32)
Calcium: 9.6 mg/dL (ref 8.9–10.3)
Chloride: 102 mmol/L (ref 101–111)
Creatinine, Ser: 0.83 mg/dL (ref 0.44–1.00)
GFR calc Af Amer: >60 mL/min (ref >60)
GFR calc non Af Amer: >60 mL/min (ref >60)
GLUCOSE: 103 mg/dL — AB (ref 65–99)
POTASSIUM: 3.4 mmol/L — AB (ref 3.5–5.1)
Sodium: 135 mmol/L (ref 135–145)
TOTAL PROTEIN: 7.7 g/dL (ref 6.5–8.1)
Total Bilirubin: 1.1 mg/dL (ref 0.3–1.2)

## 2015-03-04 LAB — URINALYSIS COMPLETE WITH MICROSCOPIC (ARMC ONLY)
BACTERIA UA: NONE SEEN
Bilirubin Urine: NEGATIVE
Glucose, UA: NEGATIVE mg/dL
HGB URINE DIPSTICK: NEGATIVE
LEUKOCYTES UA: NEGATIVE
Nitrite: NEGATIVE
PH: 5 (ref 5.0–8.0)
PROTEIN: 30 mg/dL — AB
SPECIFIC GRAVITY, URINE: 1.019 (ref 1.005–1.030)

## 2015-03-04 LAB — CBC
HEMATOCRIT: 42.4 % (ref 35.0–47.0)
Hemoglobin: 14.6 g/dL (ref 12.0–16.0)
MCH: 29.9 pg (ref 26.0–34.0)
MCHC: 34.4 g/dL (ref 32.0–36.0)
MCV: 87 fL (ref 80.0–100.0)
Platelets: 242 10*3/uL (ref 150–440)
RBC: 4.87 MIL/uL (ref 3.80–5.20)
RDW: 13.6 % (ref 11.5–14.5)
WBC: 6.6 10*3/uL (ref 3.6–11.0)

## 2015-03-04 LAB — TROPONIN I: Troponin I: <0.03 ng/mL (ref <0.031)

## 2015-03-04 MED ORDER — SODIUM CHLORIDE 0.9 % IV BOLUS (SEPSIS)
1000.0000 mL | Freq: Once | INTRAVENOUS | Status: AC
Start: 2015-03-04 — End: 2015-03-05
  Administered 2015-03-04: 1000 mL via INTRAVENOUS

## 2015-03-04 NOTE — ED Notes (Signed)
Reviewed pt's cc and orders/results; lab called to add-on troponin; EKG ordered & pt taken to triage for recheck; family updated on wait time and all voice understanding

## 2015-03-04 NOTE — ED Notes (Signed)
Pt to ER with family.  Family states pt has slept all night and all day.  States she did not get up until after 4 this afternoon.  They state they could not wake her.  Dr. Humphrey Rolls sent family out with patient with concerns of dehydration.  Family states now that pt is awake she is acting normally, states has eaten and urinated since being awake.

## 2015-03-04 NOTE — ED Notes (Signed)
Patient transported to CT 

## 2015-03-04 NOTE — ED Notes (Signed)
Pt arrived with family.  Reported from family that patient has increased lethargy and weakness.  Called family PCP and told to come to ED.  Pt currently being treated for UTI with antibiotics - family unsure of which med.  Pt oriented to person and place, disoriented to time - pt with underlying alzheimers and demetia.  Per family patient is at her baseline orientation.  MD at [facility]

## 2015-03-04 NOTE — ED Provider Notes (Addendum)
[facility] Emergency Department Provider Note  ____________________________________________  Time seen: Approximately 1010 PM  I have reviewed the triage vital signs and the nursing notes.   HISTORY  Chief Complaint Weakness    HPI Kelly Brady is a 81 y.o. female with a history of Parkinson's and dementia who is presenting tonight after being difficult to arouse earlier today. The family says that she slept until about 4:00 this afternoon. At that time she was able to be awoken. Since then she has had no complaints and has been acting at her baseline. The patient denies any pain, shortness of breath or weakness currently. The family is at the bedside and corroborates that she is currently at her baseline. They discussed the presentation with her primary doctor, Dr. Humphrey Rolls, and the doctor recommended evaluation in the emergency department.Dr. Humphrey Rolls suspected that the patient may have been dehydrated. The patient has recently been diagnosed with a urinary tract infection has been treated with both Levaquin and then Macrobid. The patient is currently taking Macrobid which was prescribed on November 9 and written as a 10 day course. Per the family the patient has also been having frequent falls lately and yesterday fell and hit the front of her head. The daughter reports that she is up-to-date with her tetanus shot.   Past Medical History  Diagnosis Date  . Closed fracture of unspecified part of vertebral column without mention of spinal cord injury   . Lumbago   . Rheumatoid arthritis(714.0)   . Unspecified vitamin D deficiency   . Disturbance of skin sensation   . Paralysis agitans ([place])   . Dementia without behavioral disturbance 01/11/2013    Patient Active Problem List   Diagnosis Date Noted  . Dementia without behavioral disturbance 01/11/2013    Past Surgical History  Procedure Laterality Date  . Lumbar spine surgery  2008  . Cataract extraction  Right 2013  . Vertebroplasty  2011  . Melanoma excision Left 2013    arm    Current Outpatient Rx  Name  Route  Sig  Dispense  Refill  . amitriptyline (ELAVIL) 10 MG tablet   Oral   Take 10 mg by mouth at bedtime. 1/2 tablet daily         . CALCIUM PO   Oral   Take 1 tablet by mouth 2 (two) times daily.          . carbidopa-levodopa (SINEMET CR) 50-200 MG per tablet   Oral   Take 1 tablet by mouth 3 (three) times daily.          Marland Kitchen diltiazem (CARDIZEM CD) 120 MG 24 hr capsule   Oral   Take 1 capsule by mouth daily.         Marland Kitchen escitalopram (LEXAPRO) 10 MG tablet   Oral   Take 10 mg by mouth daily.         Marland Kitchen FLUZONE HIGH-DOSE 0.5 ML SUSY            0   . lansoprazole (PREVACID) 30 MG capsule   Oral   Take 1 capsule by mouth daily.         . pramipexole (MIRAPEX) 0.25 MG tablet   Oral   Take 0.25 mg by mouth 2 (two) times daily.         Marland Kitchen PREVIDENT 5000 DRY MOUTH 1.1 % GEL dental gel               . solifenacin (VESICARE)  10 MG tablet   Oral   Take 10 mg by mouth daily.           Allergies Latex  Family History  Problem Relation Age of Onset  . Kidney failure Mother   . Stroke Father     Social History Social History  Substance Use Topics  . Smoking status: Former Smoker    Types: Cigarettes    Quit date: 09/12/1962  . Smokeless tobacco: None  . Alcohol Use: No    Review of Systems Constitutional: No fever/chills Eyes: No visual changes. ENT: No sore throat. Cardiovascular: Denies chest pain. Respiratory: Denies shortness of breath. Gastrointestinal: No abdominal pain.  No nausea, no vomiting.  No diarrhea.  No constipation. Genitourinary: Negative for dysuria. Musculoskeletal: Negative for back pain. Skin: Negative for rash. Neurological: Negative for headaches, focal weakness or numbness.  10-point ROS otherwise negative.  ____________________________________________   PHYSICAL EXAM:  VITAL SIGNS: ED Triage  Vitals  Enc Vitals Group     BP 03/04/15 1835 105/59 mmHg     Pulse Rate 03/04/15 1835 87     Resp 03/04/15 1835 19     Temp 03/04/15 1835 97.8 F (36.6 C)     Temp Source 03/04/15 1835 Oral     SpO2 03/04/15 1835 97 %     Weight 03/04/15 1835 155 lb (70.308 kg)     Height 03/04/15 1835 5\' 1"  (1.549 m)     Head Cir --      Peak Flow --      Pain Score --      Pain Loc --      Pain Edu? --      Excl. in [place]? --     Constitutional: Alert and oriented. Well appearing and in no acute distress. Eyes: Conjunctivae are normal. PERRL. EOMI. Head: One by half a centimeter abrasion to the left side of the forehead. Nose: No congestion/rhinnorhea. Mouth/Throat: Mucous membranes are moist.  Oropharynx non-erythematous. Neck: No stridor.   Cardiovascular: Normal rate, regular rhythm. Grossly normal heart sounds.  Good peripheral circulation. Respiratory: Normal respiratory effort.  No retractions. Lungs CTAB. Gastrointestinal: Soft and nontender. No distention. No abdominal bruits. No CVA tenderness. Musculoskeletal: No lower extremity tenderness nor edema.  No joint effusions. Neurologic:  Normal speech and language. No gross focal neurologic deficits are appreciated. No gait instability. Skin:  Skin is warm, dry and intact. No rash noted. Psychiatric: Mood and affect are normal. Speech and behavior are normal.  ____________________________________________   LABS (all labs ordered are listed, but only abnormal results are displayed)  Labs Reviewed  COMPREHENSIVE METABOLIC PANEL - Abnormal; Notable for the following:    Potassium 3.4 (*)    Glucose, Bld 103 (*)    ALT 6 (*)    All other components within normal limits  URINALYSIS COMPLETEWITH MICROSCOPIC ([facility] ONLY) - Abnormal; Notable for the following:    Color, Urine AMBER (*)    APPearance CLEAR (*)    Ketones, ur 1+ (*)    Protein, ur 30 (*)    Squamous Epithelial / LPF 0-5 (*)    All other components within normal limits   CBC  TROPONIN I  TROPONIN I   ____________________________________________  EKG  ED ECG REPORT I, Doran Stabler, the attending physician, personally viewed and interpreted this ECG.   Date: 03/04/2015  EKG Time: 2048  Rate: 86  Rhythm: normal sinus rhythm  Axis: Left axis deviation  Intervals:none  ST&T Change: No  ST segment elevation or depression. T-wave inversions in V2 and V3 which are new from previous.  ____________________________________________  RADIOLOGY  Small left pleural effusion noted. Mild peribronchial thickening seen. Lungs otherwise clear. CT of the brain with stable atrophy and chronic small vessel ischemia. No intracranial acute process. ____________________________________________   PROCEDURES    ____________________________________________   INITIAL IMPRESSION / ASSESSMENT AND PLAN / ED COURSE  Pertinent labs & imaging results that were available during my care of the patient were reviewed by me and considered in my medical decision making (see chart for details).  ----------------------------------------- 11:19 PM on 03/04/2015 -----------------------------------------  Patient is resting comfortably at this time. Continues to be at her baseline mental status. Discussed with the family the lab as well as imaging findings. I also discussed that it may be the Macrobid that is causing her drowsiness. We also discussed that the urine does not appear to be infected at this time. I recommended to the family that they stop the Macrobid at home. Will be checking a second troponin to make sure there is no positive finding in this lab. The patient did have some T-wave inversions which were new from previous but without any chest pain or shortness of breath by history. As long as the patient's repeat troponin is negative the patient will be discharged home to follow up with her primary care doctor. The family understands the plan to stop the patient's  Macrobid and is willing to comply.    Unclear cause of the left-sided pleural effusion but there is no infiltrate. Furthermore, the patient has no cough fever or white count. Unlikely to be infectious. ____________________________________________   FINAL CLINICAL IMPRESSION(S) / ED DIAGNOSES  Generalized weakness, possibly as a side effect of Macrobid.    Orbie Pyo, MD 03/04/15 2321  Signed out to Dr. Owens Shark.  Orbie Pyo, MD 03/04/15 [phone]

## 2015-03-05 DIAGNOSIS — R531 Weakness: Secondary | ICD-10-CM | POA: Diagnosis not present

## 2015-03-05 NOTE — ED Provider Notes (Signed)
I assumed care of the patient at 11:00 PM from Dr. Dineen Kid who requested that I follow up on repeat troponin which was negative. His recommendation was for the patient to be discharge if this were the case.  Gregor Hams, MD 03/05/15 [phone]

## 2015-03-19 DIAGNOSIS — I10 Essential (primary) hypertension: Secondary | ICD-10-CM | POA: Diagnosis not present

## 2015-03-19 DIAGNOSIS — G20 Parkinson's disease: Secondary | ICD-10-CM | POA: Diagnosis not present

## 2015-03-19 DIAGNOSIS — E785 Hyperlipidemia, unspecified: Secondary | ICD-10-CM | POA: Diagnosis not present

## 2015-03-19 DIAGNOSIS — F329 Major depressive disorder, single episode, unspecified: Secondary | ICD-10-CM | POA: Diagnosis not present

## 2015-03-19 DIAGNOSIS — M1990 Unspecified osteoarthritis, unspecified site: Secondary | ICD-10-CM | POA: Diagnosis not present

## 2015-03-19 DIAGNOSIS — F0280 Dementia in other diseases classified elsewhere without behavioral disturbance: Secondary | ICD-10-CM | POA: Diagnosis not present

## 2015-03-19 DIAGNOSIS — F419 Anxiety disorder, unspecified: Secondary | ICD-10-CM | POA: Diagnosis not present

## 2015-03-23 DIAGNOSIS — F0280 Dementia in other diseases classified elsewhere without behavioral disturbance: Secondary | ICD-10-CM | POA: Diagnosis not present

## 2015-03-23 DIAGNOSIS — M1990 Unspecified osteoarthritis, unspecified site: Secondary | ICD-10-CM | POA: Diagnosis not present

## 2015-03-23 DIAGNOSIS — G20 Parkinson's disease: Secondary | ICD-10-CM | POA: Diagnosis not present

## 2015-03-23 DIAGNOSIS — I10 Essential (primary) hypertension: Secondary | ICD-10-CM | POA: Diagnosis not present

## 2015-03-23 DIAGNOSIS — F419 Anxiety disorder, unspecified: Secondary | ICD-10-CM | POA: Diagnosis not present

## 2015-03-23 DIAGNOSIS — F329 Major depressive disorder, single episode, unspecified: Secondary | ICD-10-CM | POA: Diagnosis not present

## 2015-03-25 DIAGNOSIS — F0280 Dementia in other diseases classified elsewhere without behavioral disturbance: Secondary | ICD-10-CM | POA: Diagnosis not present

## 2015-03-25 DIAGNOSIS — M1990 Unspecified osteoarthritis, unspecified site: Secondary | ICD-10-CM | POA: Diagnosis not present

## 2015-03-25 DIAGNOSIS — I10 Essential (primary) hypertension: Secondary | ICD-10-CM | POA: Diagnosis not present

## 2015-03-25 DIAGNOSIS — F419 Anxiety disorder, unspecified: Secondary | ICD-10-CM | POA: Diagnosis not present

## 2015-03-25 DIAGNOSIS — F329 Major depressive disorder, single episode, unspecified: Secondary | ICD-10-CM | POA: Diagnosis not present

## 2015-03-25 DIAGNOSIS — G20 Parkinson's disease: Secondary | ICD-10-CM | POA: Diagnosis not present

## 2015-03-26 DIAGNOSIS — F419 Anxiety disorder, unspecified: Secondary | ICD-10-CM | POA: Diagnosis not present

## 2015-03-26 DIAGNOSIS — I10 Essential (primary) hypertension: Secondary | ICD-10-CM | POA: Diagnosis not present

## 2015-03-26 DIAGNOSIS — G20 Parkinson's disease: Secondary | ICD-10-CM | POA: Diagnosis not present

## 2015-03-26 DIAGNOSIS — F329 Major depressive disorder, single episode, unspecified: Secondary | ICD-10-CM | POA: Diagnosis not present

## 2015-03-26 DIAGNOSIS — M1990 Unspecified osteoarthritis, unspecified site: Secondary | ICD-10-CM | POA: Diagnosis not present

## 2015-03-26 DIAGNOSIS — F0280 Dementia in other diseases classified elsewhere without behavioral disturbance: Secondary | ICD-10-CM | POA: Diagnosis not present

## 2015-03-27 DIAGNOSIS — G20 Parkinson's disease: Secondary | ICD-10-CM | POA: Diagnosis not present

## 2015-03-27 DIAGNOSIS — F329 Major depressive disorder, single episode, unspecified: Secondary | ICD-10-CM | POA: Diagnosis not present

## 2015-03-27 DIAGNOSIS — F0280 Dementia in other diseases classified elsewhere without behavioral disturbance: Secondary | ICD-10-CM | POA: Diagnosis not present

## 2015-03-27 DIAGNOSIS — F419 Anxiety disorder, unspecified: Secondary | ICD-10-CM | POA: Diagnosis not present

## 2015-03-31 DIAGNOSIS — F329 Major depressive disorder, single episode, unspecified: Secondary | ICD-10-CM | POA: Diagnosis not present

## 2015-03-31 DIAGNOSIS — M1990 Unspecified osteoarthritis, unspecified site: Secondary | ICD-10-CM | POA: Diagnosis not present

## 2015-03-31 DIAGNOSIS — I10 Essential (primary) hypertension: Secondary | ICD-10-CM | POA: Diagnosis not present

## 2015-03-31 DIAGNOSIS — F0280 Dementia in other diseases classified elsewhere without behavioral disturbance: Secondary | ICD-10-CM | POA: Diagnosis not present

## 2015-03-31 DIAGNOSIS — F419 Anxiety disorder, unspecified: Secondary | ICD-10-CM | POA: Diagnosis not present

## 2015-03-31 DIAGNOSIS — G20 Parkinson's disease: Secondary | ICD-10-CM | POA: Diagnosis not present

## 2015-04-02 DIAGNOSIS — M1990 Unspecified osteoarthritis, unspecified site: Secondary | ICD-10-CM | POA: Diagnosis not present

## 2015-04-02 DIAGNOSIS — F329 Major depressive disorder, single episode, unspecified: Secondary | ICD-10-CM | POA: Diagnosis not present

## 2015-04-02 DIAGNOSIS — G20 Parkinson's disease: Secondary | ICD-10-CM | POA: Diagnosis not present

## 2015-04-02 DIAGNOSIS — I10 Essential (primary) hypertension: Secondary | ICD-10-CM | POA: Diagnosis not present

## 2015-04-02 DIAGNOSIS — F419 Anxiety disorder, unspecified: Secondary | ICD-10-CM | POA: Diagnosis not present

## 2015-04-02 DIAGNOSIS — F0280 Dementia in other diseases classified elsewhere without behavioral disturbance: Secondary | ICD-10-CM | POA: Diagnosis not present

## 2015-04-03 DIAGNOSIS — I10 Essential (primary) hypertension: Secondary | ICD-10-CM | POA: Diagnosis not present

## 2015-04-03 DIAGNOSIS — M1990 Unspecified osteoarthritis, unspecified site: Secondary | ICD-10-CM | POA: Diagnosis not present

## 2015-04-03 DIAGNOSIS — F419 Anxiety disorder, unspecified: Secondary | ICD-10-CM | POA: Diagnosis not present

## 2015-04-03 DIAGNOSIS — G20 Parkinson's disease: Secondary | ICD-10-CM | POA: Diagnosis not present

## 2015-04-03 DIAGNOSIS — F329 Major depressive disorder, single episode, unspecified: Secondary | ICD-10-CM | POA: Diagnosis not present

## 2015-04-03 DIAGNOSIS — F0280 Dementia in other diseases classified elsewhere without behavioral disturbance: Secondary | ICD-10-CM | POA: Diagnosis not present

## 2015-04-07 DIAGNOSIS — M1990 Unspecified osteoarthritis, unspecified site: Secondary | ICD-10-CM | POA: Diagnosis not present

## 2015-04-07 DIAGNOSIS — G20 Parkinson's disease: Secondary | ICD-10-CM | POA: Diagnosis not present

## 2015-04-07 DIAGNOSIS — F0280 Dementia in other diseases classified elsewhere without behavioral disturbance: Secondary | ICD-10-CM | POA: Diagnosis not present

## 2015-04-07 DIAGNOSIS — F419 Anxiety disorder, unspecified: Secondary | ICD-10-CM | POA: Diagnosis not present

## 2015-04-07 DIAGNOSIS — F329 Major depressive disorder, single episode, unspecified: Secondary | ICD-10-CM | POA: Diagnosis not present

## 2015-04-07 DIAGNOSIS — I10 Essential (primary) hypertension: Secondary | ICD-10-CM | POA: Diagnosis not present

## 2015-04-17 DIAGNOSIS — F0280 Dementia in other diseases classified elsewhere without behavioral disturbance: Secondary | ICD-10-CM | POA: Diagnosis not present

## 2015-04-17 DIAGNOSIS — G20 Parkinson's disease: Secondary | ICD-10-CM | POA: Diagnosis not present

## 2015-04-17 DIAGNOSIS — M1990 Unspecified osteoarthritis, unspecified site: Secondary | ICD-10-CM | POA: Diagnosis not present

## 2015-04-17 DIAGNOSIS — I10 Essential (primary) hypertension: Secondary | ICD-10-CM | POA: Diagnosis not present

## 2015-04-17 DIAGNOSIS — F329 Major depressive disorder, single episode, unspecified: Secondary | ICD-10-CM | POA: Diagnosis not present

## 2015-04-17 DIAGNOSIS — F419 Anxiety disorder, unspecified: Secondary | ICD-10-CM | POA: Diagnosis not present

## 2015-04-19 DIAGNOSIS — I10 Essential (primary) hypertension: Secondary | ICD-10-CM | POA: Diagnosis not present

## 2015-04-19 DIAGNOSIS — G20 Parkinson's disease: Secondary | ICD-10-CM | POA: Diagnosis not present

## 2015-04-19 DIAGNOSIS — F329 Major depressive disorder, single episode, unspecified: Secondary | ICD-10-CM | POA: Diagnosis not present

## 2015-04-19 DIAGNOSIS — M1990 Unspecified osteoarthritis, unspecified site: Secondary | ICD-10-CM | POA: Diagnosis not present

## 2015-04-19 DIAGNOSIS — F0280 Dementia in other diseases classified elsewhere without behavioral disturbance: Secondary | ICD-10-CM | POA: Diagnosis not present

## 2015-04-19 DIAGNOSIS — E785 Hyperlipidemia, unspecified: Secondary | ICD-10-CM | POA: Diagnosis not present

## 2015-04-19 DIAGNOSIS — F419 Anxiety disorder, unspecified: Secondary | ICD-10-CM | POA: Diagnosis not present

## 2015-04-23 DIAGNOSIS — G20 Parkinson's disease: Secondary | ICD-10-CM | POA: Diagnosis not present

## 2015-04-23 DIAGNOSIS — F329 Major depressive disorder, single episode, unspecified: Secondary | ICD-10-CM | POA: Diagnosis not present

## 2015-04-23 DIAGNOSIS — I10 Essential (primary) hypertension: Secondary | ICD-10-CM | POA: Diagnosis not present

## 2015-04-23 DIAGNOSIS — F419 Anxiety disorder, unspecified: Secondary | ICD-10-CM | POA: Diagnosis not present

## 2015-04-23 DIAGNOSIS — F0280 Dementia in other diseases classified elsewhere without behavioral disturbance: Secondary | ICD-10-CM | POA: Diagnosis not present

## 2015-04-23 DIAGNOSIS — M1990 Unspecified osteoarthritis, unspecified site: Secondary | ICD-10-CM | POA: Diagnosis not present

## 2015-04-30 DIAGNOSIS — F419 Anxiety disorder, unspecified: Secondary | ICD-10-CM | POA: Diagnosis not present

## 2015-04-30 DIAGNOSIS — F0280 Dementia in other diseases classified elsewhere without behavioral disturbance: Secondary | ICD-10-CM | POA: Diagnosis not present

## 2015-04-30 DIAGNOSIS — M1990 Unspecified osteoarthritis, unspecified site: Secondary | ICD-10-CM | POA: Diagnosis not present

## 2015-04-30 DIAGNOSIS — G20 Parkinson's disease: Secondary | ICD-10-CM | POA: Diagnosis not present

## 2015-04-30 DIAGNOSIS — I10 Essential (primary) hypertension: Secondary | ICD-10-CM | POA: Diagnosis not present

## 2015-04-30 DIAGNOSIS — F329 Major depressive disorder, single episode, unspecified: Secondary | ICD-10-CM | POA: Diagnosis not present

## 2015-05-07 DIAGNOSIS — F329 Major depressive disorder, single episode, unspecified: Secondary | ICD-10-CM | POA: Diagnosis not present

## 2015-05-07 DIAGNOSIS — G20 Parkinson's disease: Secondary | ICD-10-CM | POA: Diagnosis not present

## 2015-05-07 DIAGNOSIS — I10 Essential (primary) hypertension: Secondary | ICD-10-CM | POA: Diagnosis not present

## 2015-05-07 DIAGNOSIS — M1990 Unspecified osteoarthritis, unspecified site: Secondary | ICD-10-CM | POA: Diagnosis not present

## 2015-05-07 DIAGNOSIS — F419 Anxiety disorder, unspecified: Secondary | ICD-10-CM | POA: Diagnosis not present

## 2015-05-07 DIAGNOSIS — F0280 Dementia in other diseases classified elsewhere without behavioral disturbance: Secondary | ICD-10-CM | POA: Diagnosis not present

## 2015-05-14 DIAGNOSIS — I10 Essential (primary) hypertension: Secondary | ICD-10-CM | POA: Diagnosis not present

## 2015-05-14 DIAGNOSIS — F419 Anxiety disorder, unspecified: Secondary | ICD-10-CM | POA: Diagnosis not present

## 2015-05-14 DIAGNOSIS — F0280 Dementia in other diseases classified elsewhere without behavioral disturbance: Secondary | ICD-10-CM | POA: Diagnosis not present

## 2015-05-14 DIAGNOSIS — M1990 Unspecified osteoarthritis, unspecified site: Secondary | ICD-10-CM | POA: Diagnosis not present

## 2015-05-14 DIAGNOSIS — F329 Major depressive disorder, single episode, unspecified: Secondary | ICD-10-CM | POA: Diagnosis not present

## 2015-05-14 DIAGNOSIS — G20 Parkinson's disease: Secondary | ICD-10-CM | POA: Diagnosis not present

## 2015-05-18 DIAGNOSIS — M1990 Unspecified osteoarthritis, unspecified site: Secondary | ICD-10-CM | POA: Diagnosis not present

## 2015-05-18 DIAGNOSIS — F0280 Dementia in other diseases classified elsewhere without behavioral disturbance: Secondary | ICD-10-CM | POA: Diagnosis not present

## 2015-05-18 DIAGNOSIS — F329 Major depressive disorder, single episode, unspecified: Secondary | ICD-10-CM | POA: Diagnosis not present

## 2015-05-18 DIAGNOSIS — F419 Anxiety disorder, unspecified: Secondary | ICD-10-CM | POA: Diagnosis not present

## 2015-05-18 DIAGNOSIS — G20 Parkinson's disease: Secondary | ICD-10-CM | POA: Diagnosis not present

## 2015-05-18 DIAGNOSIS — I10 Essential (primary) hypertension: Secondary | ICD-10-CM | POA: Diagnosis not present

## 2015-05-19 DIAGNOSIS — G20 Parkinson's disease: Secondary | ICD-10-CM | POA: Diagnosis not present

## 2015-05-19 DIAGNOSIS — M1990 Unspecified osteoarthritis, unspecified site: Secondary | ICD-10-CM | POA: Diagnosis not present

## 2015-05-19 DIAGNOSIS — F0280 Dementia in other diseases classified elsewhere without behavioral disturbance: Secondary | ICD-10-CM | POA: Diagnosis not present

## 2015-05-19 DIAGNOSIS — F419 Anxiety disorder, unspecified: Secondary | ICD-10-CM | POA: Diagnosis not present

## 2015-05-19 DIAGNOSIS — I10 Essential (primary) hypertension: Secondary | ICD-10-CM | POA: Diagnosis not present

## 2015-05-19 DIAGNOSIS — F329 Major depressive disorder, single episode, unspecified: Secondary | ICD-10-CM | POA: Diagnosis not present

## 2015-05-20 DIAGNOSIS — E785 Hyperlipidemia, unspecified: Secondary | ICD-10-CM | POA: Diagnosis not present

## 2015-05-20 DIAGNOSIS — M1990 Unspecified osteoarthritis, unspecified site: Secondary | ICD-10-CM | POA: Diagnosis not present

## 2015-05-20 DIAGNOSIS — F329 Major depressive disorder, single episode, unspecified: Secondary | ICD-10-CM | POA: Diagnosis not present

## 2015-05-20 DIAGNOSIS — G20 Parkinson's disease: Secondary | ICD-10-CM | POA: Diagnosis not present

## 2015-05-20 DIAGNOSIS — F419 Anxiety disorder, unspecified: Secondary | ICD-10-CM | POA: Diagnosis not present

## 2015-05-20 DIAGNOSIS — I10 Essential (primary) hypertension: Secondary | ICD-10-CM | POA: Diagnosis not present

## 2015-05-20 DIAGNOSIS — F0280 Dementia in other diseases classified elsewhere without behavioral disturbance: Secondary | ICD-10-CM | POA: Diagnosis not present

## 2015-06-13 IMAGING — CT CT CERVICAL SPINE WITHOUT CONTRAST
3 of 5 series · 12 of 33 positions shown, 14 images · non-contrast
Comparison: 08/06/2013.

CLINICAL DATA: Pt says she got up in the dark this am to go to the
BR and fell; hit the right side of her head on the night stand; pt
denies dizziness before and after fall; pt denies LOC, laceration to
right side of forehead; c/o headache and neck pain; no bleeding at
this time; pt alert and oriented x 2; talking in complete coherent
sentences. Pt with hx of dementia and Parkinson disease.

EXAM:
CT HEAD WITHOUT CONTRAST
CT CERVICAL SPINE WITHOUT CONTRAST
TECHNIQUE: Multidetector CT imaging of the head and cervical spine was
performed following the standard protocol without intravenous
contrast. Multiplanar CT image reconstructions of the cervical spine
were also generated.

[Series 8: sag bone · sagittal · 0.21mm/px · 5 of 52 slices shown, 6 images]
[im 18/52  bone]
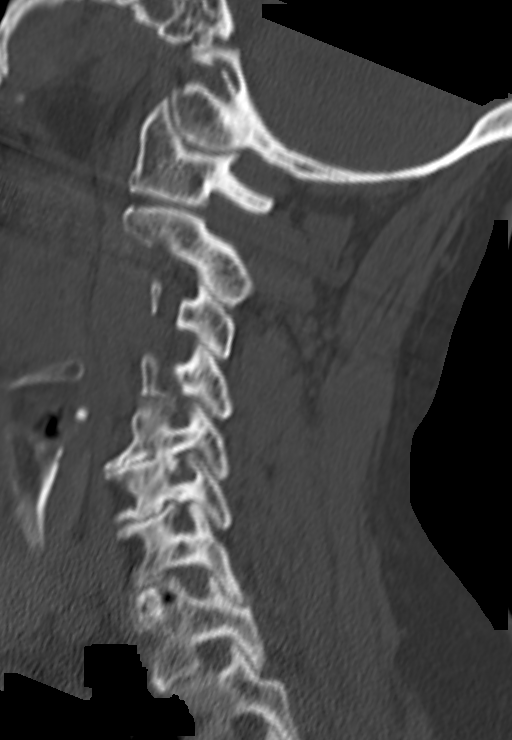
[im 22/52  bone]
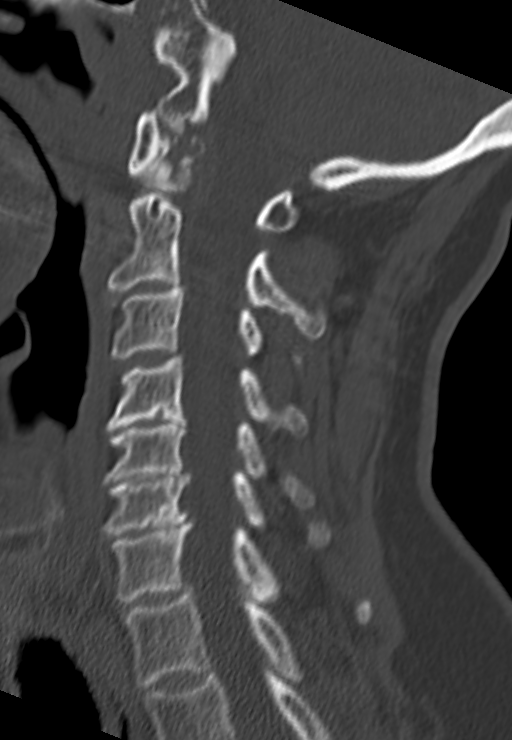
[im 26/52  soft-tissue]
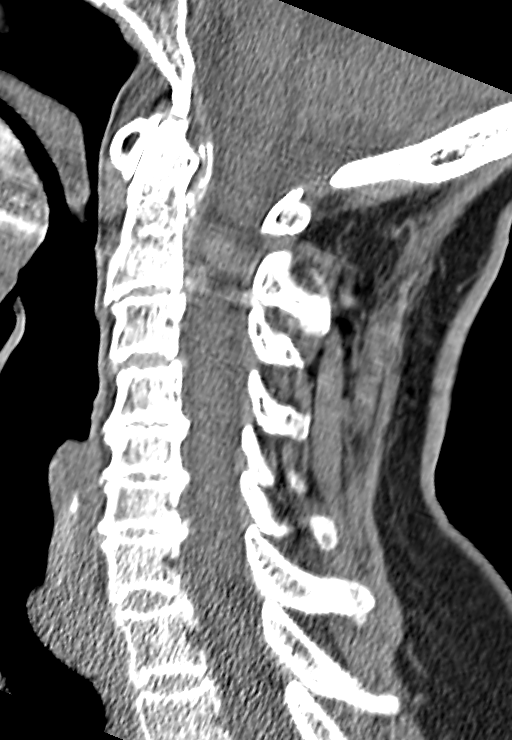
[im 26/52  bone]
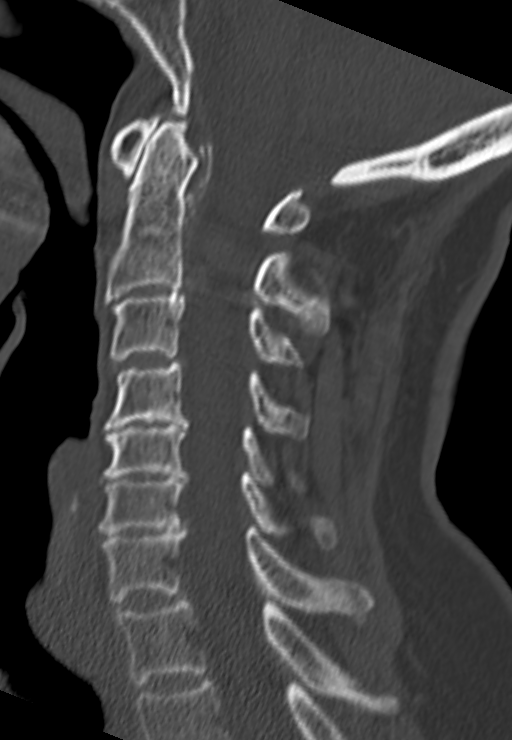
[im 30/52  bone]
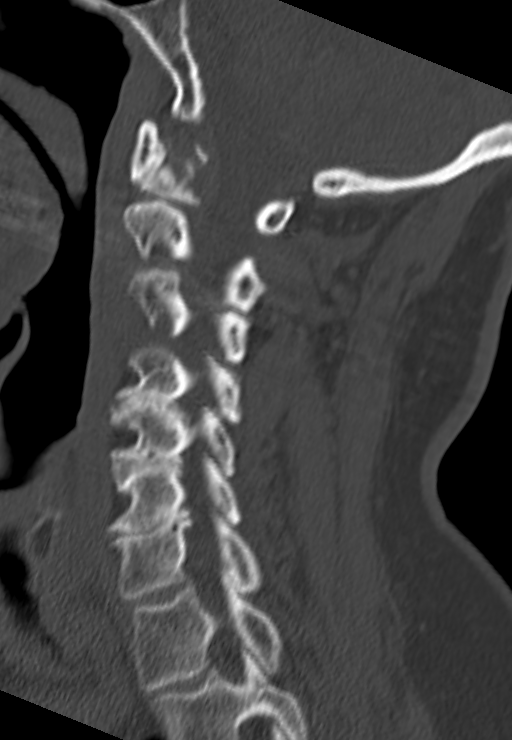
[im 35/52  bone]
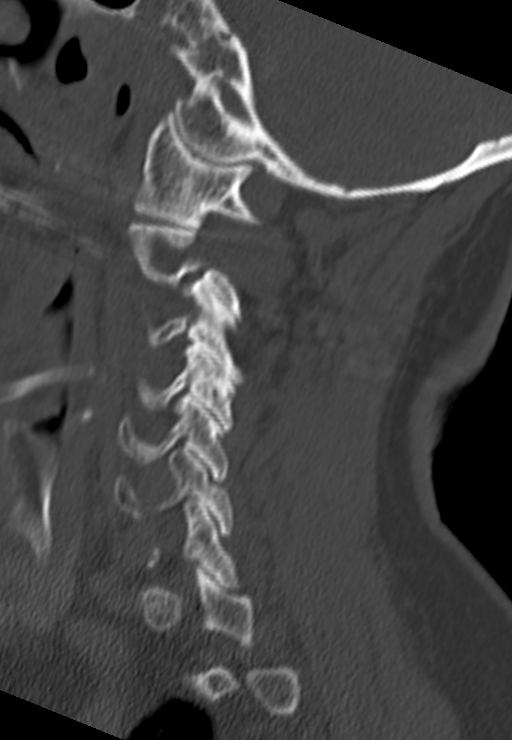

[Series 9: cor bone · coronal · 0.21mm/px · 3 of 43 slices shown]
[im 9/43  bone]
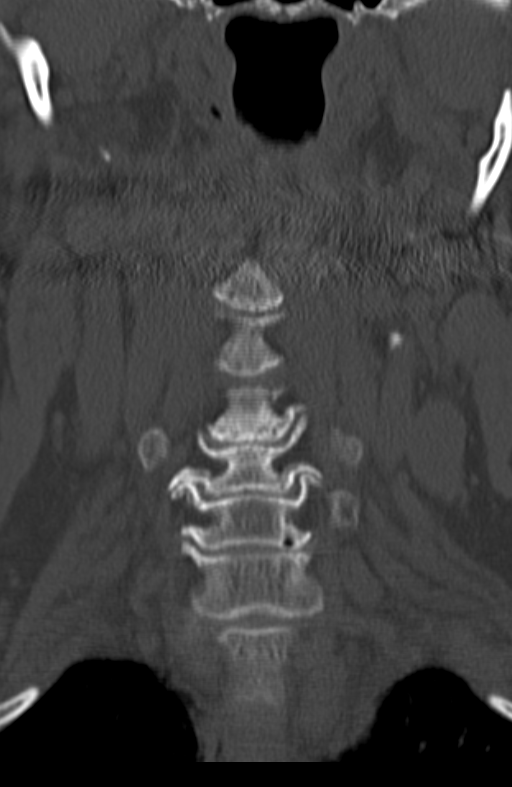
[im 17/43  bone]
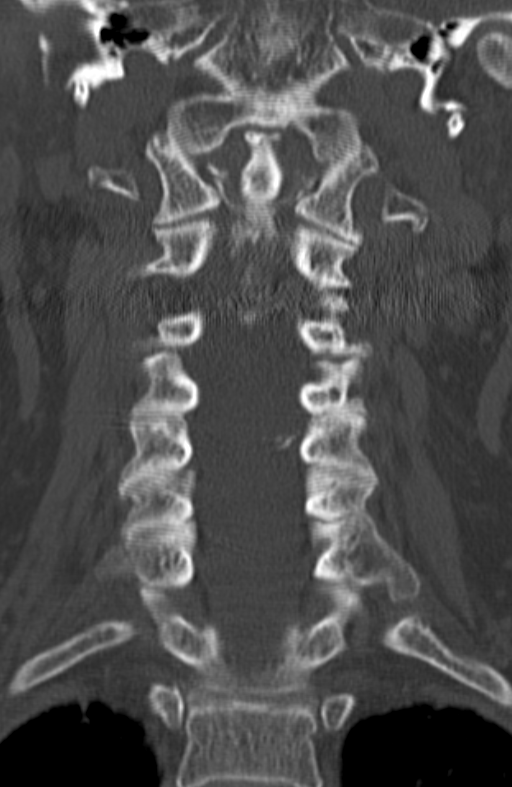
[im 26/43  bone]
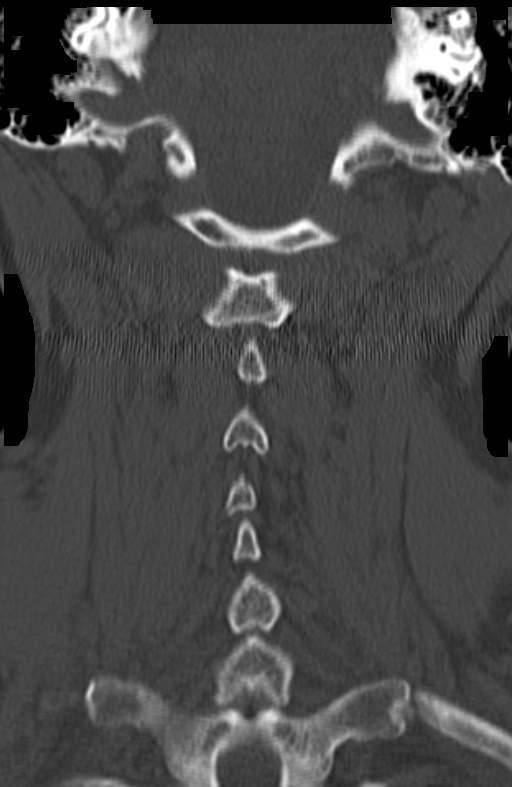

[Series 10: orthogonal axials · axial · 0.18mm/px · z∈[+365,+444]mm · 4 of 74 slices shown, 5 images]
[im 15/74  soft-tissue]
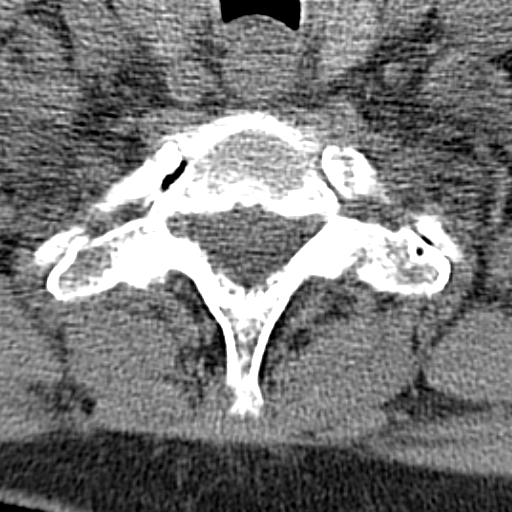
[im 15/74  bone]
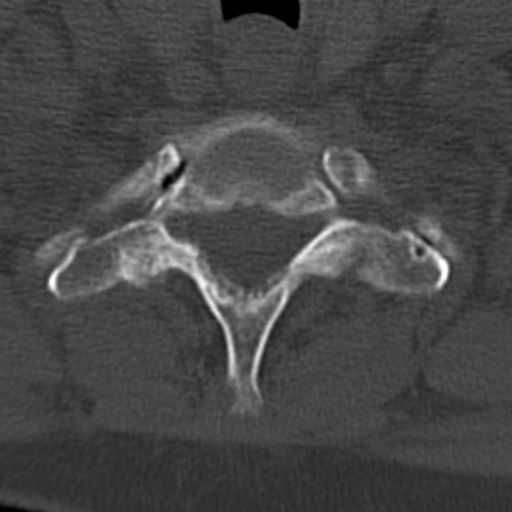
[im 30/74  bone]
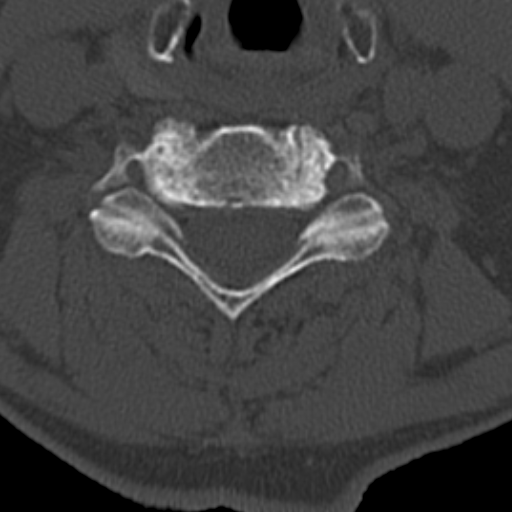
[im 44/74  bone]
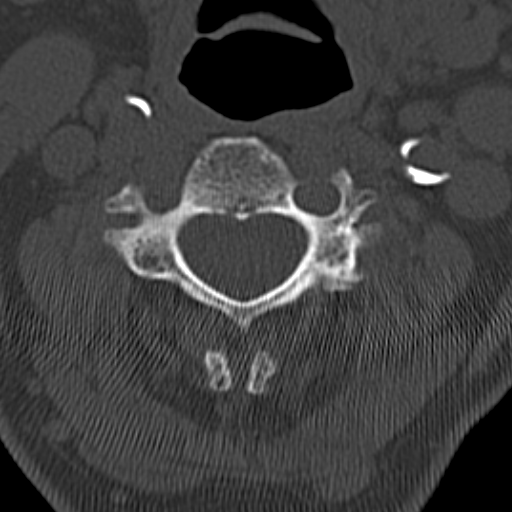
[im 59/74  bone]
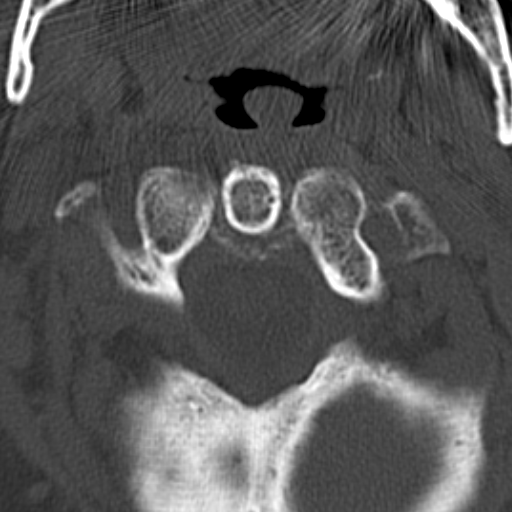

[12 of 33 positions shown; findings below may reference images not displayed]

FINDINGS: CT HEAD FINDINGS

Diffusely enlarged ventricles and subarachnoid spaces. Patchy white
matter low density in both cerebral hemispheres. No skull fracture,
intracranial hemorrhage or paranasal sinus air-fluid levels.
Bilateral cavernous internal carotid artery atheromatous
calcifications.

CT CERVICAL SPINE FINDINGS

Multilevel degenerative changes are again demonstrated. These
include facet degenerative changes with stable minimal
anterolisthesis at the C3-4 and C7-T1 levels. No prevertebral soft
tissue swelling, fractures or subluxations. Small amount of
bilateral carotid artery calcification. Multiple bilateral thyroid
nodules. The largest is on the right, measuring 1.5 cm in maximum
diameter on image number 71. The largest nodule was not previously
included. The previously included nodules have not changed
significantly.
IMPRESSION: 1. No acute abnormality.
2. Stable atrophy and chronic small vessel white matter ischemic
changes.
3. Multilevel cervical spine degenerative changes.
4. Multinodular thyroid. The largest nodule is on the right,
measuring 1.5 cm in maximum diameter. Consider further evaluation
with thyroid ultrasound. If patient is clinically hyperthyroid,
consider nuclear medicine thyroid uptake and scan.
5. Mild bilateral carotid artery atheromatous calcification.

## 2015-07-03 DIAGNOSIS — R350 Frequency of micturition: Secondary | ICD-10-CM | POA: Diagnosis not present

## 2015-07-23 DIAGNOSIS — N390 Urinary tract infection, site not specified: Secondary | ICD-10-CM | POA: Diagnosis not present

## 2015-09-13 IMAGING — CR DG WRIST COMPLETE 3+V*R*
1 series · 4 of 4 positions shown · non-contrast
Comparison: 09/26/2013 report

CLINICAL DATA: Fell out of head, landing on the right wrist and
hand. Regional pain

EXAM:
RIGHT WRIST - COMPLETE 3+ VIEW

[Series 1: dxr wrist rt comp with obliques · 0.14mm/px · 4 of 4 slices shown]
[im 1/4]
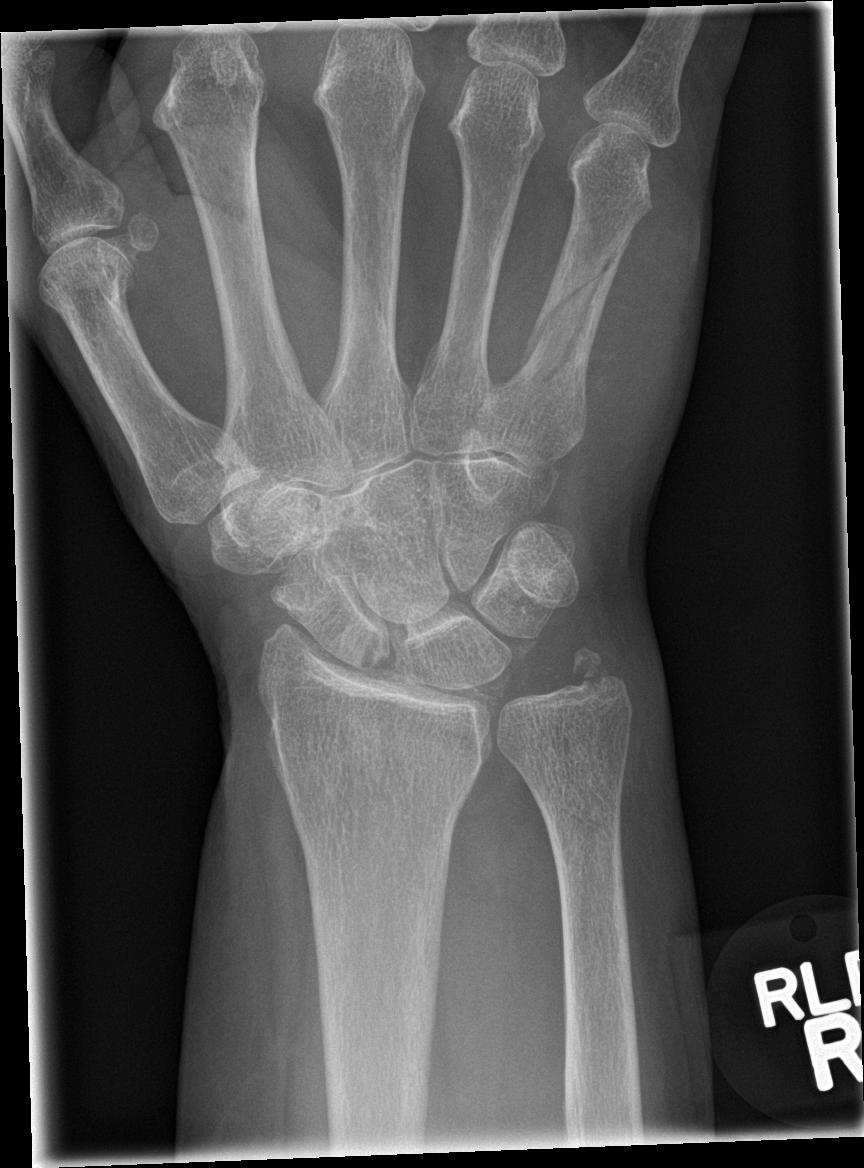
[im 2/4]
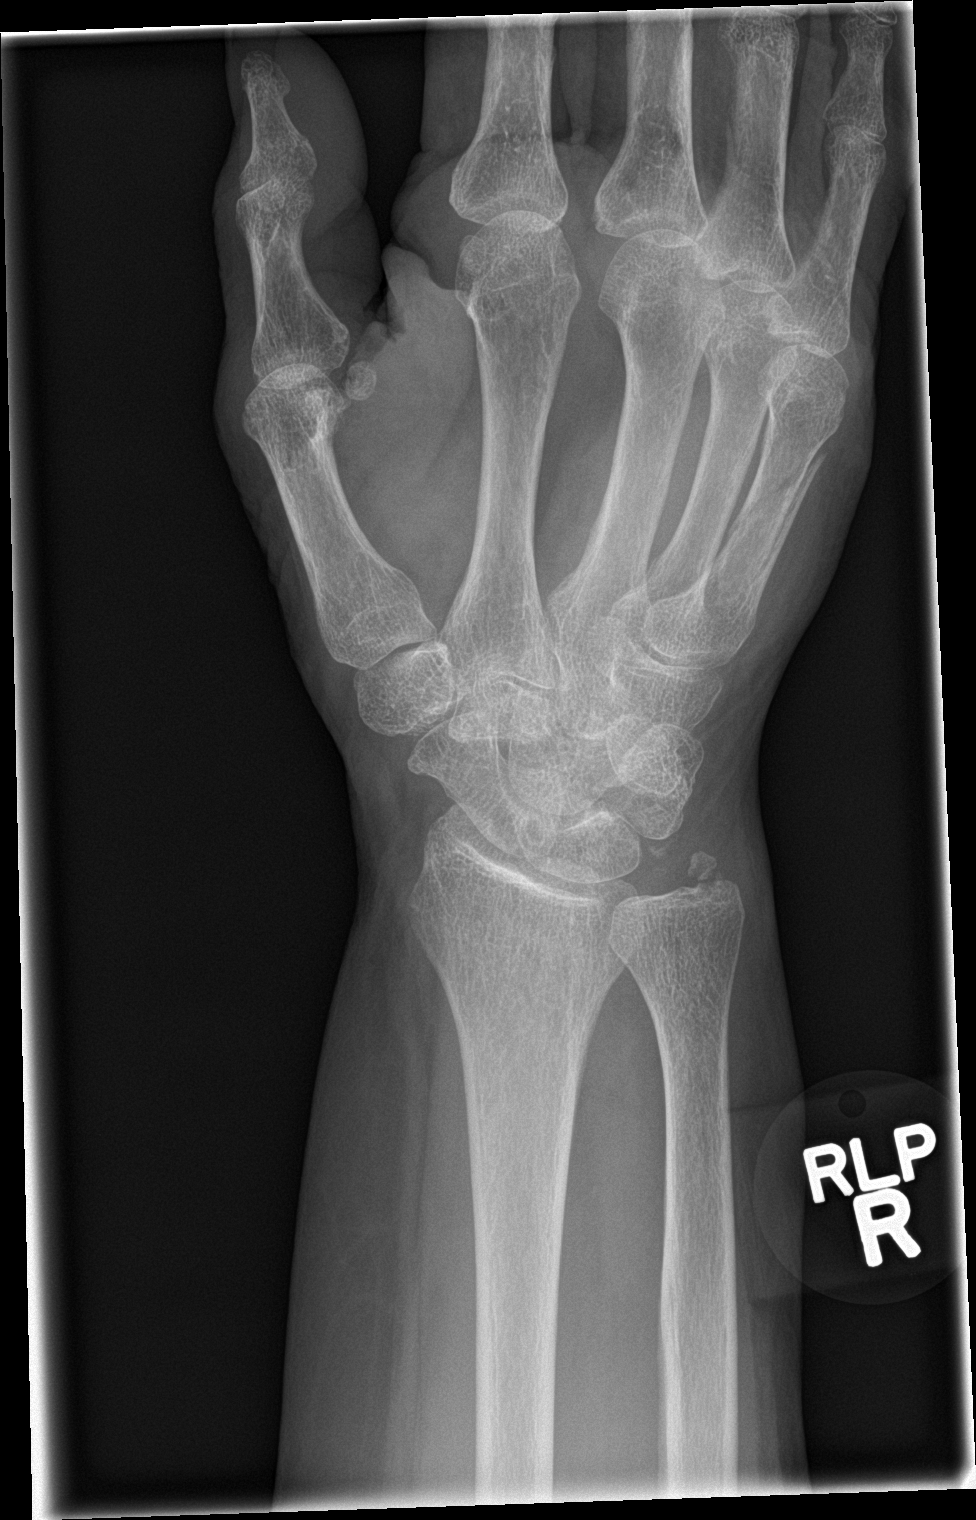
[im 3/4]
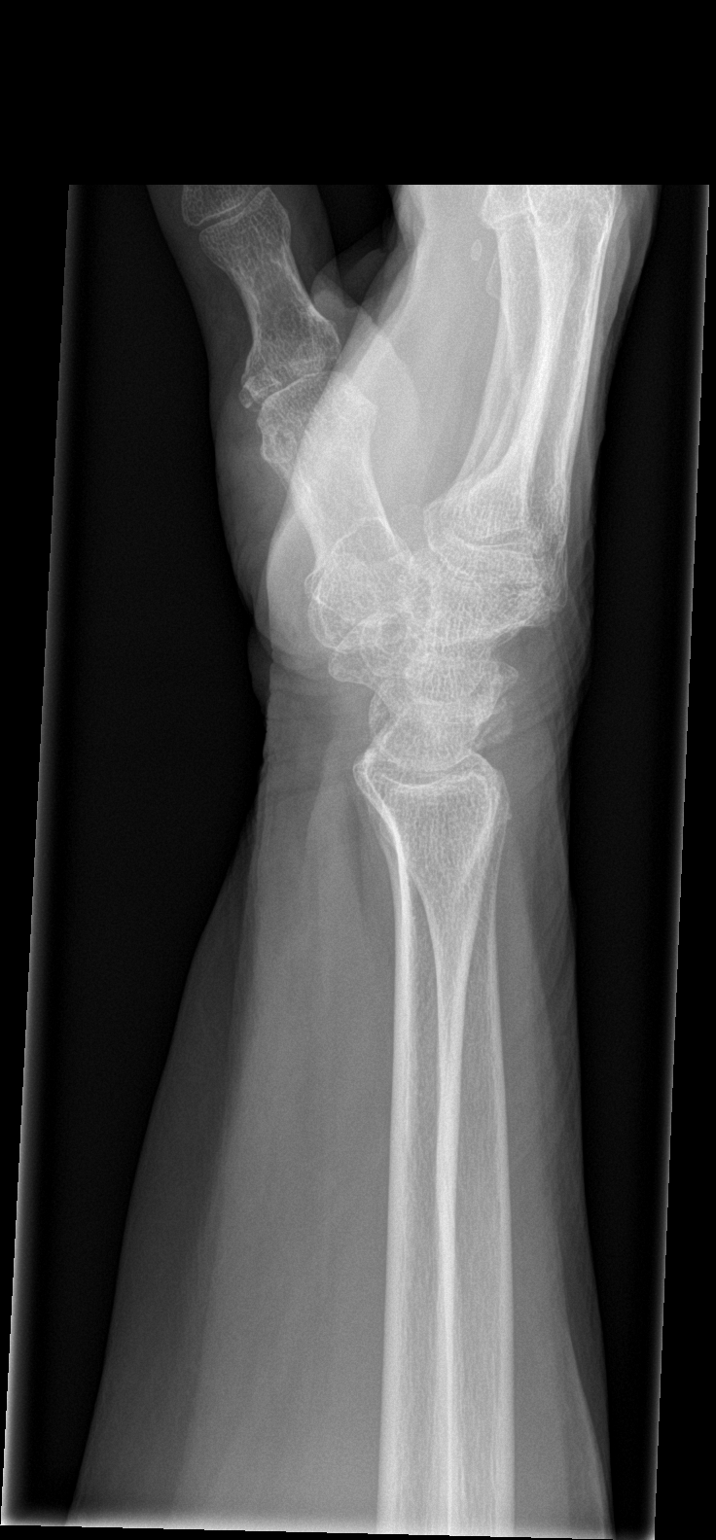
[im 4/4]
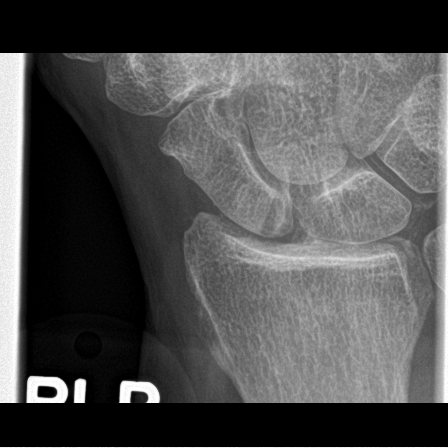

[4 of 4 positions shown; findings below may reference images not displayed]

FINDINGS: There is an acute fracture of the fifth metacarpal without
significant angulation or displacement. This is a spiral fracture in
the midportion. No acute fracture of the carpal bones. There is
widening of the scapholunate distance consistent with chronic
ligament tear. There is chondrocalcinosis. There is an old nonunited
ulnar styloid fracture which was acute in September 2013 by report.
IMPRESSION: Nondisplaced spiral fracture of the midportion of the fifth
metacarpal.

Chronic scapholunate ligament tear.

Chondrocalcinosis.

Old nonunited ulnar styloid fracture.

## 2017-08-16 ENCOUNTER — Other Ambulatory Visit: Payer: Self-pay | Admitting: Internal Medicine

## 2017-08-16 MED ORDER — ESCITALOPRAM OXALATE 10 MG PO TABS: 10.0000 mg | ORAL_TABLET | Freq: Every day | ORAL | 3 refills | Status: DC

## 2017-08-24 ENCOUNTER — Other Ambulatory Visit: Payer: Self-pay

## 2017-08-24 MED ORDER — CARBIDOPA-LEVODOPA ER 50-200 MG PO TBCR: EXTENDED_RELEASE_TABLET | ORAL | 1 refills | Status: DC

## 2017-08-25 ENCOUNTER — Other Ambulatory Visit: Payer: Self-pay

## 2017-08-25 MED ORDER — CARBIDOPA-LEVODOPA ER 50-200 MG PO TBCR: EXTENDED_RELEASE_TABLET | ORAL | 1 refills | Status: DC

## 2017-08-27 ENCOUNTER — Other Ambulatory Visit: Payer: Self-pay | Admitting: Internal Medicine

## 2017-08-28 ENCOUNTER — Other Ambulatory Visit: Payer: Self-pay

## 2017-08-28 MED ORDER — CARBIDOPA-LEVODOPA ER 50-200 MG PO TBCR: EXTENDED_RELEASE_TABLET | ORAL | 1 refills | Status: DC

## 2017-12-07 ENCOUNTER — Inpatient Hospital Stay
Admission: EM | Admit: 2017-12-07 | Discharge: 2017-12-15 | DRG: 871 | Disposition: A | Discharge status: Hospice | Payer: Medicare Other | Attending: Internal Medicine | Admitting: Internal Medicine

## 2017-12-07 ENCOUNTER — Emergency Department: Payer: Medicare Other

## 2017-12-07 ENCOUNTER — Other Ambulatory Visit: Payer: Self-pay

## 2017-12-07 ENCOUNTER — Encounter: Payer: Self-pay | Admitting: Emergency Medicine

## 2017-12-07 DIAGNOSIS — J9600 Acute respiratory failure, unspecified whether with hypoxia or hypercapnia: Secondary | ICD-10-CM | POA: Diagnosis not present

## 2017-12-07 DIAGNOSIS — E860 Dehydration: Secondary | ICD-10-CM | POA: Diagnosis present

## 2017-12-07 DIAGNOSIS — I4891 Unspecified atrial fibrillation: Secondary | ICD-10-CM | POA: Diagnosis present

## 2017-12-07 DIAGNOSIS — R4182 Altered mental status, unspecified: Secondary | ICD-10-CM

## 2017-12-07 DIAGNOSIS — G20 Parkinson's disease: Secondary | ICD-10-CM | POA: Diagnosis present

## 2017-12-07 DIAGNOSIS — R627 Adult failure to thrive: Secondary | ICD-10-CM | POA: Diagnosis present

## 2017-12-07 DIAGNOSIS — N390 Urinary tract infection, site not specified: Secondary | ICD-10-CM | POA: Diagnosis present

## 2017-12-07 DIAGNOSIS — J320 Chronic maxillary sinusitis: Secondary | ICD-10-CM | POA: Diagnosis present

## 2017-12-07 DIAGNOSIS — Z66 Do not resuscitate: Secondary | ICD-10-CM | POA: Diagnosis not present

## 2017-12-07 DIAGNOSIS — I10 Essential (primary) hypertension: Secondary | ICD-10-CM | POA: Diagnosis present

## 2017-12-07 DIAGNOSIS — N3000 Acute cystitis without hematuria: Secondary | ICD-10-CM | POA: Diagnosis not present

## 2017-12-07 DIAGNOSIS — A419 Sepsis, unspecified organism: Principal | ICD-10-CM | POA: Diagnosis present

## 2017-12-07 DIAGNOSIS — G309 Alzheimer's disease, unspecified: Secondary | ICD-10-CM | POA: Diagnosis present

## 2017-12-07 DIAGNOSIS — J322 Chronic ethmoidal sinusitis: Secondary | ICD-10-CM | POA: Diagnosis present

## 2017-12-07 DIAGNOSIS — E876 Hypokalemia: Secondary | ICD-10-CM | POA: Diagnosis present

## 2017-12-07 DIAGNOSIS — M069 Rheumatoid arthritis, unspecified: Secondary | ICD-10-CM | POA: Diagnosis present

## 2017-12-07 DIAGNOSIS — Z85820 Personal history of malignant melanoma of skin: Secondary | ICD-10-CM | POA: Diagnosis not present

## 2017-12-07 DIAGNOSIS — B9620 Unspecified Escherichia coli [E. coli] as the cause of diseases classified elsewhere: Secondary | ICD-10-CM | POA: Diagnosis present

## 2017-12-07 DIAGNOSIS — R1310 Dysphagia, unspecified: Secondary | ICD-10-CM | POA: Diagnosis present

## 2017-12-07 DIAGNOSIS — J9601 Acute respiratory failure with hypoxia: Secondary | ICD-10-CM | POA: Diagnosis present

## 2017-12-07 DIAGNOSIS — R0603 Acute respiratory distress: Secondary | ICD-10-CM | POA: Diagnosis present

## 2017-12-07 DIAGNOSIS — Z79899 Other long term (current) drug therapy: Secondary | ICD-10-CM

## 2017-12-07 DIAGNOSIS — J189 Pneumonia, unspecified organism: Secondary | ICD-10-CM

## 2017-12-07 DIAGNOSIS — Z515 Encounter for palliative care: Secondary | ICD-10-CM | POA: Diagnosis present

## 2017-12-07 DIAGNOSIS — R000 Tachycardia, unspecified: Secondary | ICD-10-CM | POA: Diagnosis not present

## 2017-12-07 DIAGNOSIS — E785 Hyperlipidemia, unspecified: Secondary | ICD-10-CM | POA: Diagnosis present

## 2017-12-07 DIAGNOSIS — Z87891 Personal history of nicotine dependence: Secondary | ICD-10-CM

## 2017-12-07 DIAGNOSIS — J9690 Respiratory failure, unspecified, unspecified whether with hypoxia or hypercapnia: Secondary | ICD-10-CM | POA: Diagnosis present

## 2017-12-07 DIAGNOSIS — R4020 Unspecified coma: Secondary | ICD-10-CM | POA: Diagnosis present

## 2017-12-07 DIAGNOSIS — Z9911 Dependence on respirator [ventilator] status: Secondary | ICD-10-CM

## 2017-12-07 DIAGNOSIS — F0281 Dementia in other diseases classified elsewhere with behavioral disturbance: Secondary | ICD-10-CM | POA: Diagnosis present

## 2017-12-07 DIAGNOSIS — Z7189 Other specified counseling: Secondary | ICD-10-CM | POA: Diagnosis not present

## 2017-12-07 DIAGNOSIS — Z4659 Encounter for fitting and adjustment of other gastrointestinal appliance and device: Secondary | ICD-10-CM

## 2017-12-07 DIAGNOSIS — R112 Nausea with vomiting, unspecified: Secondary | ICD-10-CM | POA: Diagnosis present

## 2017-12-07 DIAGNOSIS — G9341 Metabolic encephalopathy: Secondary | ICD-10-CM | POA: Diagnosis present

## 2017-12-07 DIAGNOSIS — F419 Anxiety disorder, unspecified: Secondary | ICD-10-CM | POA: Diagnosis present

## 2017-12-07 DIAGNOSIS — R6521 Severe sepsis with septic shock: Secondary | ICD-10-CM | POA: Diagnosis present

## 2017-12-07 DIAGNOSIS — H9193 Unspecified hearing loss, bilateral: Secondary | ICD-10-CM | POA: Diagnosis present

## 2017-12-07 LAB — URINALYSIS, COMPLETE (UACMP) WITH MICROSCOPIC
BILIRUBIN URINE: NEGATIVE
Glucose, UA: NEGATIVE mg/dL
Ketones, ur: 5 mg/dL — AB
Nitrite: POSITIVE — AB
PH: 5 (ref 5.0–8.0)
Protein, ur: 30 mg/dL — AB
SPECIFIC GRAVITY, URINE: 1.016 (ref 1.005–1.030)
Squamous Epithelial / LPF: NONE SEEN (ref 0–5)
WBC, UA: >50 WBC/hpf — ABNORMAL HIGH (ref 0–5)

## 2017-12-07 LAB — TROPONIN I
Troponin I: <0.03 ng/mL (ref <0.03)
Troponin I: <0.03 ng/mL (ref <0.03)

## 2017-12-07 LAB — COMPREHENSIVE METABOLIC PANEL
ALT: <5 U/L (ref 0–44)
ANION GAP: 7 (ref 5–15)
AST: 18 U/L (ref 15–41)
Albumin: 3.7 g/dL (ref 3.5–5.0)
Alkaline Phosphatase: 52 U/L (ref 38–126)
BUN: 33 mg/dL — ABNORMAL HIGH (ref 8–23)
CHLORIDE: 104 mmol/L (ref 98–111)
CO2: 26 mmol/L (ref 22–32)
Calcium: 9 mg/dL (ref 8.9–10.3)
Creatinine, Ser: 1.26 mg/dL — ABNORMAL HIGH (ref 0.44–1.00)
GFR, EST AFRICAN AMERICAN: 44 mL/min — AB (ref >60)
GFR, EST NON AFRICAN AMERICAN: 38 mL/min — AB (ref >60)
Glucose, Bld: 98 mg/dL (ref 70–99)
POTASSIUM: 3.5 mmol/L (ref 3.5–5.1)
Sodium: 137 mmol/L (ref 135–145)
Total Bilirubin: 0.5 mg/dL (ref 0.3–1.2)
Total Protein: 7 g/dL (ref 6.5–8.1)

## 2017-12-07 LAB — CBC WITH DIFFERENTIAL/PLATELET
BASOS PCT: 1 %
Basophils Absolute: 0 10*3/uL (ref 0–0.1)
EOS ABS: 0.3 10*3/uL (ref 0–0.7)
Eosinophils Relative: 3 %
HCT: 35 % (ref 35.0–47.0)
HEMOGLOBIN: 12.2 g/dL (ref 12.0–16.0)
Lymphocytes Relative: 26 %
Lymphs Abs: 2.4 10*3/uL (ref 1.0–3.6)
MCH: 30.5 pg (ref 26.0–34.0)
MCHC: 34.9 g/dL (ref 32.0–36.0)
MCV: 87.5 fL (ref 80.0–100.0)
MONOS PCT: 9 %
Monocytes Absolute: 0.8 10*3/uL (ref 0.2–0.9)
NEUTROS PCT: 61 %
Neutro Abs: 5.7 10*3/uL (ref 1.4–6.5)
Platelets: 253 10*3/uL (ref 150–440)
RBC: 4 MIL/uL (ref 3.80–5.20)
RDW: 13.9 % (ref 11.5–14.5)
WBC: 9.2 10*3/uL (ref 3.6–11.0)

## 2017-12-07 LAB — URINE DRUG SCREEN, QUALITATIVE (ARMC ONLY)
Amphetamines, Ur Screen: NOT DETECTED
BARBITURATES, UR SCREEN: NOT DETECTED
Cannabinoid 50 Ng, Ur ~~LOC~~: NOT DETECTED
Cocaine Metabolite,Ur ~~LOC~~: NOT DETECTED
MDMA (Ecstasy)Ur Screen: NOT DETECTED
METHADONE SCREEN, URINE: NOT DETECTED
Opiate, Ur Screen: NOT DETECTED
Phencyclidine (PCP) Ur S: NOT DETECTED
Tricyclic, Ur Screen: NOT DETECTED

## 2017-12-07 LAB — AMMONIA: Ammonia: 11 umol/L (ref 9–35)

## 2017-12-07 LAB — ETHANOL

## 2017-12-07 LAB — SALICYLATE LEVEL: Salicylate Lvl: <7 mg/dL (ref 2.8–30.0)

## 2017-12-07 LAB — GLUCOSE, CAPILLARY: GLUCOSE-CAPILLARY: 123 mg/dL — AB (ref 70–99)

## 2017-12-07 LAB — ACETAMINOPHEN LEVEL

## 2017-12-07 LAB — MRSA PCR SCREENING: MRSA BY PCR: NEGATIVE

## 2017-12-07 MED ORDER — SODIUM CHLORIDE 0.9 % IV BOLUS
1000.0000 mL | Freq: Once | INTRAVENOUS | Status: AC
  Administered 2017-12-07: 1000 mL via INTRAVENOUS

## 2017-12-07 MED ORDER — SODIUM CHLORIDE 0.9 % IV SOLN
INTRAVENOUS | Status: DC
  Administered 2017-12-07 – 2017-12-09 (×3): via INTRAVENOUS
  Filled 2017-12-07 (×8): qty 1000

## 2017-12-07 MED ORDER — ACETAMINOPHEN 325 MG PO TABS
650.0000 mg | ORAL_TABLET | ORAL | Status: DC | PRN
  Administered 2017-12-12 – 2017-12-14 (×5): 650 mg via ORAL
  Filled 2017-12-07 (×5): qty 2

## 2017-12-07 MED ORDER — SODIUM CHLORIDE 0.9 % IV SOLN
250.0000 mL | INTRAVENOUS | Status: DC | PRN
  Administered 2017-12-07: 250 mL via INTRAVENOUS

## 2017-12-07 MED ORDER — SODIUM CHLORIDE 0.9 % IV SOLN: INTRAVENOUS | Status: DC

## 2017-12-07 MED ORDER — ENOXAPARIN SODIUM 30 MG/0.3ML ~~LOC~~ SOLN
30.0000 mg | SUBCUTANEOUS | Status: DC
  Administered 2017-12-07: 30 mg via SUBCUTANEOUS
  Filled 2017-12-07: qty 0.3

## 2017-12-07 MED ORDER — CARBIDOPA-LEVODOPA ER 50-200 MG PO TBCR
0.5000 | EXTENDED_RELEASE_TABLET | Freq: Two times a day (BID) | ORAL | Status: DC
  Administered 2017-12-11 – 2017-12-12 (×4): 0.5 via ORAL
  Filled 2017-12-07 (×10): qty 0.5

## 2017-12-07 MED ORDER — FAMOTIDINE IN NACL 20-0.9 MG/50ML-% IV SOLN
20.0000 mg | Freq: Two times a day (BID) | INTRAVENOUS | Status: DC
  Administered 2017-12-07 – 2017-12-08 (×3): 20 mg via INTRAVENOUS
  Filled 2017-12-07 (×3): qty 50

## 2017-12-07 MED ORDER — ORAL CARE MOUTH RINSE
15.0000 mL | OROMUCOSAL | Status: DC
  Administered 2017-12-07 – 2017-12-10 (×23): 15 mL via OROMUCOSAL

## 2017-12-07 MED ORDER — KETAMINE HCL 10 MG/ML IJ SOLN
INTRAMUSCULAR | Status: AC | PRN
  Administered 2017-12-07: 100 mg via INTRAVENOUS

## 2017-12-07 MED ORDER — ALBUTEROL SULFATE (2.5 MG/3ML) 0.083% IN NEBU
2.5000 mg | INHALATION_SOLUTION | RESPIRATORY_TRACT | Status: DC
  Administered 2017-12-07 – 2017-12-09 (×10): 2.5 mg via RESPIRATORY_TRACT
  Filled 2017-12-07 (×11): qty 3

## 2017-12-07 MED ORDER — ASPIRIN 81 MG PO CHEW: 324.0000 mg | CHEWABLE_TABLET | ORAL | Status: AC

## 2017-12-07 MED ORDER — PROPOFOL 1000 MG/100ML IV EMUL
INTRAVENOUS | Status: AC
  Administered 2017-12-07: 5 ug/kg/min via INTRAVENOUS
  Filled 2017-12-07: qty 100

## 2017-12-07 MED ORDER — ROCURONIUM BROMIDE 50 MG/5ML IV SOLN
INTRAVENOUS | Status: AC | PRN
  Administered 2017-12-07: 70 mg via INTRAVENOUS

## 2017-12-07 MED ORDER — VANCOMYCIN HCL IN DEXTROSE 1-5 GM/200ML-% IV SOLN
1000.0000 mg | Freq: Once | INTRAVENOUS | Status: AC
  Administered 2017-12-07: 1000 mg via INTRAVENOUS
  Filled 2017-12-07: qty 200

## 2017-12-07 MED ORDER — PROPOFOL 1000 MG/100ML IV EMUL: 5.0000 ug/kg/min | Freq: Once | INTRAVENOUS | Status: DC

## 2017-12-07 MED ORDER — FENTANYL 2500MCG IN NS 250ML (10MCG/ML) PREMIX INFUSION
0.0000 ug/h | INTRAVENOUS | Status: DC
  Administered 2017-12-07: 100 ug/h via INTRAVENOUS
  Administered 2017-12-08: 200 ug/h via INTRAVENOUS
  Filled 2017-12-07 (×3): qty 250

## 2017-12-07 MED ORDER — VANCOMYCIN HCL IN DEXTROSE 1-5 GM/200ML-% IV SOLN
1000.0000 mg | INTRAVENOUS | Status: DC
  Administered 2017-12-08: 1000 mg via INTRAVENOUS
  Filled 2017-12-07: qty 200

## 2017-12-07 MED ORDER — DILTIAZEM HCL ER COATED BEADS 120 MG PO CP24
120.0000 mg | ORAL_CAPSULE | Freq: Every day | ORAL | Status: DC
Start: 2017-12-07 — End: 2017-12-10
  Filled 2017-12-07 (×4): qty 1

## 2017-12-07 MED ORDER — SODIUM CHLORIDE 0.9 % IV SOLN
1.0000 g | Freq: Once | INTRAVENOUS | Status: AC
  Administered 2017-12-07: 1 g via INTRAVENOUS
  Filled 2017-12-07: qty 10

## 2017-12-07 MED ORDER — ASPIRIN 300 MG RE SUPP
300.0000 mg | RECTAL | Status: AC
  Administered 2017-12-07: 300 mg via RECTAL
  Filled 2017-12-07: qty 1

## 2017-12-07 MED ORDER — CHLORHEXIDINE GLUCONATE 0.12% ORAL RINSE (MEDLINE KIT)
15.0000 mL | Freq: Two times a day (BID) | OROMUCOSAL | Status: DC
  Administered 2017-12-07 – 2017-12-15 (×10): 15 mL via OROMUCOSAL

## 2017-12-07 MED ORDER — SODIUM CHLORIDE 0.9 % IV SOLN
1.0000 g | INTRAVENOUS | Status: AC
  Administered 2017-12-08 – 2017-12-12 (×5): 1 g via INTRAVENOUS
  Filled 2017-12-07 (×5): qty 1

## 2017-12-07 MED ORDER — DARIFENACIN HYDROBROMIDE ER 7.5 MG PO TB24
7.5000 mg | ORAL_TABLET | Freq: Every day | ORAL | Status: DC
  Administered 2017-12-11 – 2017-12-13 (×3): 7.5 mg via ORAL
  Filled 2017-12-07 (×7): qty 1

## 2017-12-07 MED ORDER — ONDANSETRON HCL 4 MG/2ML IJ SOLN: 4.0000 mg | Freq: Four times a day (QID) | INTRAMUSCULAR | Status: DC | PRN

## 2017-12-07 MED ORDER — PROPOFOL 1000 MG/100ML IV EMUL
5.0000 ug/kg/min | Freq: Once | INTRAVENOUS | Status: DC
  Administered 2017-12-07: 5 ug/kg/min via INTRAVENOUS

## 2017-12-07 MED ORDER — CARBIDOPA-LEVODOPA ER 50-200 MG PO TBCR
1.0000 | EXTENDED_RELEASE_TABLET | Freq: Every day | ORAL | Status: DC
  Administered 2017-12-11 – 2017-12-12 (×2): 1 via ORAL
  Filled 2017-12-07 (×7): qty 1

## 2017-12-07 NOTE — Progress Notes (Signed)
Family Meeting Note  Advance Directive:yes  Today a meeting took place with the Patient/family.  Patient is unable to participate due IN:OMVEHM capacity due to comatose state   The following clinical team members were present during this meeting:MD  The following were discussed:Patient's diagnosis: Acute comatose state, acute respiratory failure, UTI, dehydration, Parkinson's dementia, Patient's progosis: Unable to determine and Goals for treatment: Full Code  Additional follow-up to be provided: prn  Time spent during discussion:20 minutes  Gorden Harms, MD

## 2017-12-07 NOTE — Consult Note (Signed)
Reason for Consult: Respiratory Failure Referring Physician: Dr. Doreatha Massed is an 84 y.o. female.  HPI: Kelly Brady is an elderly 84 year old female with a past medical history remarkable for dementia,rheumatoid arthritis,Parkinson's disease with chronic ataxia. Was being assisted by her family when she collapsed to the floor. Patient became unresponsive, brought into the emergency department and she was intubated for airway protection. Workup in the emergency room department showed positive urinalysis, creatinine 1.2, CT scan of the head was negative for acute intracranial abnormality, EKG revealed sinus mechanism with incomplete right bundle branch block,BUN was 33  Past Medical History:  Diagnosis Date  . Closed fracture of unspecified part of vertebral column without mention of spinal cord injury   . Dementia without behavioral disturbance 01/11/2013  . Disturbance of skin sensation   . Lumbago   . Paralysis agitans ([name])   . Rheumatoid arthritis(714.0)   . Unspecified vitamin D deficiency     Past Surgical History:  Procedure Laterality Date  . CATARACT EXTRACTION Right 2013  . LUMBAR SPINE SURGERY  2008  . MELANOMA EXCISION Left 2013   arm  . VERTEBROPLASTY  2011    Family History  Problem Relation Age of Onset  . Kidney failure Mother   . Stroke Father     Social History:  reports that she quit smoking about 55 years ago. Her smoking use included cigarettes. She does not have any smokeless tobacco history on file. She reports that she does not drink alcohol or use drugs.  Allergies:  Allergies  Allergen Reactions  . Latex Anaphylaxis    Medications: I have reviewed the patient's current medications.  Results for orders placed or performed during the hospital encounter of 12/07/17 (from the past 48 hour(s))  Urinalysis, Complete w Microscopic     Status: Abnormal   Collection Time: 12/07/17 11:36 AM  Result Value Ref Range   Color, Urine YELLOW  (A) YELLOW   APPearance CLOUDY (A) CLEAR   Specific Gravity, Urine 1.016 1.005 - 1.030   pH 5.0 5.0 - 8.0   Glucose, UA NEGATIVE NEGATIVE mg/dL   Hgb urine dipstick MODERATE (A) NEGATIVE   Bilirubin Urine NEGATIVE NEGATIVE   Ketones, ur 5 (A) NEGATIVE mg/dL   Protein, ur 30 (A) NEGATIVE mg/dL   Nitrite POSITIVE (A) NEGATIVE   Leukocytes, UA LARGE (A) NEGATIVE   RBC / HPF >50 (H) 0 - 5 RBC/hpf   WBC, UA >50 (H) 0 - 5 WBC/hpf   Bacteria, UA RARE (A) NONE SEEN   Squamous Epithelial / LPF NONE SEEN 0 - 5   WBC Clumps PRESENT    Mucus PRESENT     Comment: Performed at [facility], [address] [place], [place] [address]  Urine Drug Screen, Qualitative     Status: Abnormal   Collection Time: 12/07/17 11:36 AM  Result Value Ref Range   Tricyclic, Ur Screen NONE DETECTED NONE DETECTED   Amphetamines, Ur Screen NONE DETECTED NONE DETECTED   MDMA (Ecstasy)Ur Screen NONE DETECTED NONE DETECTED   Cocaine Metabolite,Ur [place] NONE DETECTED NONE DETECTED   Opiate, Ur Screen NONE DETECTED NONE DETECTED   Phencyclidine (PCP) Ur S NONE DETECTED NONE DETECTED   Cannabinoid 50 Ng, Ur [place] NONE DETECTED NONE DETECTED   Barbiturates, Ur Screen NONE DETECTED NONE DETECTED   Benzodiazepine, Ur Scrn TEST NOT PERFORMED, REAGENT NOT AVAILABLE (A) NONE DETECTED   Methadone Scn, Ur NONE DETECTED NONE DETECTED    Comment: (NOTE) Tricyclics + metabolites,  urine    Cutoff 1000 ng/mL Amphetamines + metabolites, urine  Cutoff 1000 ng/mL MDMA (Ecstasy), urine              Cutoff 500 ng/mL Cocaine Metabolite, urine          Cutoff 300 ng/mL Opiate + metabolites, urine        Cutoff 300 ng/mL Phencyclidine (PCP), urine         Cutoff 25 ng/mL Cannabinoid, urine                 Cutoff 50 ng/mL Barbiturates + metabolites, urine  Cutoff 200 ng/mL Benzodiazepine, urine              Cutoff 200 ng/mL Methadone, urine                   Cutoff 300 ng/mL The urine drug screen provides only a preliminary,  unconfirmed analytical test result and should not be used for non-medical purposes. Clinical consideration and professional judgment should be applied to any positive drug screen result due to possible interfering substances. A more specific alternate chemical method must be used in order to obtain a confirmed analytical result. Gas chromatography / mass spectrometry (GC/MS) is the preferred confirmat ory method. Performed at [facility], [address] [place], [place] [address]   Comprehensive metabolic panel     Status: Abnormal   Collection Time: 12/07/17 11:42 AM  Result Value Ref Range   Sodium 137 135 - 145 mmol/L   Potassium 3.5 3.5 - 5.1 mmol/L   Chloride 104 98 - 111 mmol/L   CO2 26 22 - 32 mmol/L   Glucose, Bld 98 70 - 99 mg/dL   BUN 33 (H) 8 - 23 mg/dL   Creatinine, Ser 1.26 (H) 0.44 - 1.00 mg/dL   Calcium 9.0 8.9 - 10.3 mg/dL   Total Protein 7.0 6.5 - 8.1 g/dL   Albumin 3.7 3.5 - 5.0 g/dL   AST 18 15 - 41 U/L   ALT <5 0 - 44 U/L   Alkaline Phosphatase 52 38 - 126 U/L   Total Bilirubin 0.5 0.3 - 1.2 mg/dL   GFR calc non Af Amer 38 (L) >60 mL/min   GFR calc Af Amer 44 (L) >60 mL/min    Comment: (NOTE) The eGFR has been calculated using the CKD EPI equation. This calculation has not been validated in all clinical situations. eGFR's persistently <60 mL/min signify possible Chronic Kidney Disease.    Anion gap 7 5 - 15    Comment: Performed at [facility], [address] [place], [place] [address]  Ethanol     Status: None   Collection Time: 12/07/17 11:42 AM  Result Value Ref Range   Alcohol, Ethyl (B) <10 <10 mg/dL    Comment: (NOTE) Lowest detectable limit for serum alcohol is 10 mg/dL. For medical purposes only. Performed at [facility], [address] [place], [place] [address]   Acetaminophen level     Status: Abnormal   Collection Time: 12/07/17 11:42 AM  Result Value Ref Range   Acetaminophen (Tylenol), Serum <10  (L) 10 - 30 ug/mL    Comment: (NOTE) Therapeutic concentrations vary significantly. A range of 10-30 ug/mL  may be an effective concentration for many patients. However, some  are best treated at concentrations outside of this range. Acetaminophen concentrations >150 ug/mL at 4 hours after ingestion  and >50 ug/mL at 12 hours after ingestion are often associated with  toxic reactions. Performed at [facility], [address] [place], [place] [address]   Salicylate level     Status: None   Collection Time: 12/07/17 11:42 AM  Result Value Ref Range   Salicylate Lvl <7.6 2.8 - 30.0 mg/dL    Comment: Performed at [facility], [address] [place], [place] [address]  CBC with Differential     Status: None   Collection Time: 12/07/17 11:42 AM  Result Value Ref Range   WBC 9.2 3.6 - 11.0 K/uL   RBC 4.00 3.80 - 5.20 MIL/uL   Hemoglobin 12.2 12.0 - 16.0 g/dL   HCT 35.0 35.0 - 47.0 %   MCV 87.5 80.0 - 100.0 fL   MCH 30.5 26.0 - 34.0 pg   MCHC 34.9 32.0 - 36.0 g/dL   RDW 13.9 11.5 - 14.5 %   Platelets 253 150 - 440 K/uL   Neutrophils Relative % 61 %   Neutro Abs 5.7 1.4 - 6.5 K/uL   Lymphocytes Relative 26 %   Lymphs Abs 2.4 1.0 - 3.6 K/uL   Monocytes Relative 9 %   Monocytes Absolute 0.8 0.2 - 0.9 K/uL   Eosinophils Relative 3 %   Eosinophils Absolute 0.3 0 - 0.7 K/uL   Basophils Relative 1 %   Basophils Absolute 0.0 0 - 0.1 K/uL    Comment: Performed at [facility], [address] [place], [place] [address]  Troponin I     Status: None   Collection Time: 12/07/17 11:42 AM  Result Value Ref Range   Troponin I <0.03 <0.03 ng/mL    Comment: Performed at [facility], [address] [place], [place] [address]  Blood gas, arterial     Status: Abnormal (Preliminary result)   Collection Time: 12/07/17 12:05 PM  Result Value Ref Range   FIO2 0.50    Delivery systems VENTILATOR    Mode ASSIST CONTROL    VT 500 mL   LHR 12 resp/min    Peep/cpap 5.0 cm H20   Inspiratory PAP PENDING    Expiratory PAP PENDING    pH, Arterial 7.62 (HH) 7.350 - 7.450    Comment: CRITICAL RESULT CALLED TO, READ BACK BY AND VERIFIED WITH: CRITICAL VALUE 12/07/17,1245,DR. RIFENBARK/FD    pCO2 arterial 21 (L) 32.0 - 48.0 mmHg   pO2, Arterial 192 (H) 83.0 - 108.0 mmHg   Bicarbonate 21.6 20.0 - 28.0 mmol/L   Acid-Base Excess 1.8 0.0 - 2.0 mmol/L   O2 Saturation 99.8 %   Patient temperature 37.0    Collection site LEFT RADIAL    Sample type ARTERIAL DRAW    Allens test (pass/fail) POSITIVE (A) PASS    Comment: Performed at [facility], [address] [place], [place] [address]    Ct Head Wo Contrast  Result Date: 12/07/2017 CLINICAL DATA:  Altered level of consciousness. EXAM: CT HEAD WITHOUT CONTRAST TECHNIQUE: Contiguous axial images were obtained from the base of the skull through the vertex without intravenous contrast. COMPARISON:  CT scan of March 04, 2015. FINDINGS: Brain: Mild diffuse cortical atrophy. Mild chronic ischemic white matter disease. No mass effect or midline shift is noted. Ventricular size is within normal limits. There is no evidence of mass lesion, hemorrhage or acute infarction. Vascular: No hyperdense vessel or unexpected calcification. Skull: Normal. Negative for fracture or focal lesion. Sinuses/Orbits: Right maxillary and bilateral ethmoid sinusitis is noted. Other: None. IMPRESSION: Right maxillary and bilateral ethmoid sinusitis. Mild diffuse cortical atrophy. Mild  chronic ischemic white matter disease. No acute intracranial abnormality seen. Electronically Signed   By: Marijo Conception, M.D.   On: 12/07/2017 12:08   Dg Chest Port 1 View  Result Date: 12/07/2017 CLINICAL DATA:  Intubation. EXAM: PORTABLE CHEST 1 VIEW COMPARISON:  03/04/2015. FINDINGS: Endotracheal tube tip noted 3.4 cm above the carina. Heart size normal. Mild basilar atelectasis. No pleural effusion or pneumothorax. No acute bony  abnormality. IMPRESSION: 1.  Endotracheal tube tip noted 3.4 cm above the carina. 2.  Mild bibasilar atelectasis. Electronically Signed   By: Marcello Moores  Register   On: 12/07/2017 12:33    ROS Blood pressure 108/88, pulse 72, temperature (!) 96.9 F (36.1 C), resp. rate (!) 24, SpO2 100 %. Physical Exam Elderly female, presently orally intubated, is beginning to wake up, squeezes family's hands appropriately and moving extremities HEENT: Trachea midline, no thyromegaly appreciated, no carotid bruits auscultated Cardiovascular: Regular rate and rhythm Pulmonary: Coarse rhonchi Abdominal: Positive bowel sounds, soft exam Extremities: No clubbing cyanosis or edema noted Neurologic: Limited exam, patient intubated, had received sedative medication. Appears to be moving all extremities Cutaneous: Diffuse areas of bruising/ecchymosis  Assessment/Plan:  atient with underlying history of Parkinson's disease/movement disorderwith chronic ataxia,Unclear etiology of altered mental status and respiratory distress with hypoxemic respiratory failure. Has had a negative CT scan. We'll place on mechanical ventilation protocol, broadly cover patient with antibiotic coverage for sepsis most likely urinary etiology Will volume resuscitate. At this point patient does not require pressors.we'll continue patient on Rocephin,add vancomycin empirically until cultures result,continue fentanyl infusion for sedation.  Kelly Brady, D.O.  Kelly Brady 12/07/2017, 3:15 PM

## 2017-12-07 NOTE — Progress Notes (Signed)
Anticoagulation monitoring(Lovenox):  84 yo female ordered Lovenox 40 mg Q24h  Filed Weights   12/07/17 1525  Weight: 137 lb 12.6 oz (62.5 kg)   BMI    Lab Results  Component Value Date   CREATININE 1.26 (H) 12/07/2017   CREATININE 0.83 03/04/2015   CREATININE 0.87 08/06/2013   Estimated Creatinine Clearance: 27 mL/min (A) (by C-G formula based on SCr of 1.26 mg/dL (H)). Hemoglobin & Hematocrit     Component Value Date/Time   HGB 12.2 12/07/2017 1142   HGB 15.5 08/06/2013 1043   HCT 35.0 12/07/2017 1142   HCT 47.4 (H) 08/06/2013 1043     Per Protocol for Patient with estCrcl < 30 ml/min and BMI < 40, will transition to Lovenox 30 mg Q24h.

## 2017-12-07 NOTE — H&P (Signed)
[facility] NAME: Kelly Brady    MR#:  [id number]  DATE OF BIRTH:  10/27/1930  DATE OF ADMISSION:  12/07/2017  PRIMARY CARE PHYSICIAN: Lavera Guise, MD   REQUESTING/REFERRING PHYSICIAN:   CHIEF COMPLAINT:   Chief Complaint  Patient presents with  . Respiratory Distress    HISTORY OF PRESENT ILLNESS: Kelly Brady  is a 84 y.o. female with a known history of Parkinson's disease with dementia/chronic ataxia, was being assisted into her home by family whereupon she collapsed to the floor, became acutely unresponsive, brought to the emergency room for further evaluation/care, patient required emergent intubation for airway protection for acute severe obtundation, ER work-up noted for acute dehydration, urinalysis suspicious for UTI, creatinine 1.2, EKG noted for ST depression laterally with T wave inversions/right bundle branch block, patient evaluated in the ER, husband and multiple family members present, patient is currently comatose on ventilator, patient is now been admitted for acute coma due to acute hypoxic respiratory failure, probable acute urinary tract infection, dehydration.   PAST MEDICAL HISTORY:   Past Medical History:  Diagnosis Date  . Closed fracture of unspecified part of vertebral column without mention of spinal cord injury   . Dementia without behavioral disturbance 01/11/2013  . Disturbance of skin sensation   . Lumbago   . Paralysis agitans ([place])   . Rheumatoid arthritis(714.0)   . Unspecified vitamin D deficiency     PAST SURGICAL HISTORY:  Past Surgical History:  Procedure Laterality Date  . CATARACT EXTRACTION Right 2013  . LUMBAR SPINE SURGERY  2008  . MELANOMA EXCISION Left 2013   arm  . VERTEBROPLASTY  2011    SOCIAL HISTORY:  Social History   Tobacco Use  . Smoking status: Former Smoker    Types: Cigarettes    Last attempt to quit: 09/12/1962    Years since quitting: 55.2  Substance  Use Topics  . Alcohol use: No    FAMILY HISTORY:  Family History  Problem Relation Age of Onset  . Kidney failure Mother   . Stroke Father     DRUG ALLERGIES:  Allergies  Allergen Reactions  . Latex Anaphylaxis    REVIEW OF SYSTEMS: Unable to be obtained given comatose state  CONSTITUTIONAL: No fever, fatigue or weakness.  EYES: No blurred or double vision.  EARS, NOSE, AND THROAT: No tinnitus or ear pain.  RESPIRATORY: No cough, shortness of breath, wheezing or hemoptysis.  CARDIOVASCULAR: No chest pain, orthopnea, edema.  GASTROINTESTINAL: No nausea, vomiting, diarrhea or abdominal pain.  GENITOURINARY: No dysuria, hematuria.  ENDOCRINE: No polyuria, nocturia,  HEMATOLOGY: No anemia, easy bruising or bleeding SKIN: No rash or lesion. MUSCULOSKELETAL: No joint pain or arthritis.   NEUROLOGIC: No tingling, numbness, weakness.  PSYCHIATRY: No anxiety or depression.   MEDICATIONS AT HOME:  Prior to Admission medications   Medication Sig Start Date End Date Taking? Authorizing Provider  acetaminophen (TYLENOL) 500 MG tablet Take 500 mg by mouth daily.    Yes [provider]  carbidopa-levodopa (SINEMET CR) 50-200 MG tablet Take 1/2 tab in morning, 1/2 tab at lunch, and 1 tab at bedtime. 08/28/17  Yes Ronnell Freshwater, NP  escitalopram (LEXAPRO) 10 MG tablet TAKE 1 TABLET BY MOUTH AT BEDTIME 08/28/17  Yes Lavera Guise, MD  lansoprazole (PREVACID) 30 MG capsule Take 30 mg by mouth daily.    Yes [provider]  losartan-hydrochlorothiazide (HYZAAR) 50-12.5 MG tablet Take 1 tablet by mouth  daily. 10/11/17  Yes [provider]  Melatonin 5 MG TABS Take 5 mg by mouth at bedtime as needed (sleep).   Yes [provider]  pramipexole (MIRAPEX) 0.25 MG tablet Take 1 tablet (0.25MG ) by mouth at lunchtime and 1 tablet (0.25MG ) by mouth at bedtime   Yes [provider]      PHYSICAL EXAMINATION:   VITAL SIGNS: Blood pressure (!) 170/77,  pulse 76, temperature (!) 95.9 F (35.5 C), resp. rate (!) 25, SpO2 100 %.  GENERAL:  84 y.o.-year-old patient lying in the bed with no acute distress.  Frail-appearing EYES: Pupils equal, round, reactive to light and accommodation. No scleral icterus. Extraocular muscles intact.  HEENT: Head atraumatic, normocephalic. Oropharynx and nasopharynx clear.  NECK:  Supple, no jugular venous distention. No thyroid enlargement, no tenderness.  LUNGS: Normal breath sounds bilaterally, no wheezing, rales,rhonchi or crepitation. No use of accessory muscles of respiration.  CARDIOVASCULAR: S1, S2 normal. No murmurs, rubs, or gallops.  ABDOMEN: Soft, nontender, nondistended. Bowel sounds present. No organomegaly or mass.  EXTREMITIES: No pedal edema, cyanosis, or clubbing.  Diffuse muscular atrophy noted NEUROLOGIC: PERRLA  PSYCHIATRIC: The patient is comatose on ventilator   SKIN: No obvious rash, lesion, or ulcer.   LABORATORY PANEL:   CBC Recent Labs  Lab 12/07/17 1142  WBC 9.2  HGB 12.2  HCT 35.0  PLT 253  MCV 87.5  MCH 30.5  MCHC 34.9  RDW 13.9  LYMPHSABS 2.4  MONOABS 0.8  EOSABS 0.3  BASOSABS 0.0   ------------------------------------------------------------------------------------------------------------------  Chemistries  Recent Labs  Lab 12/07/17 1142  NA 137  K 3.5  CL 104  CO2 26  GLUCOSE 98  BUN 33*  CREATININE 1.26*  CALCIUM 9.0  AST 18  ALT <5  ALKPHOS 52  BILITOT 0.5   ------------------------------------------------------------------------------------------------------------------ CrCl cannot be calculated (Unknown ideal weight.). ------------------------------------------------------------------------------------------------------------------ No results for input(s): TSH, T4TOTAL, T3FREE, THYROIDAB in the last 72 hours.  Invalid input(s): FREET3   Coagulation profile No results for input(s): INR, PROTIME in the last 168  hours. ------------------------------------------------------------------------------------------------------------------- No results for input(s): DDIMER in the last 72 hours. -------------------------------------------------------------------------------------------------------------------  Cardiac Enzymes Recent Labs  Lab 12/07/17 1142  TROPONINI <0.03   ------------------------------------------------------------------------------------------------------------------ Invalid input(s): POCBNP  ---------------------------------------------------------------------------------------------------------------  Urinalysis    Component Value Date/Time   COLORURINE YELLOW (A) 12/07/2017 1136   APPEARANCEUR CLOUDY (A) 12/07/2017 1136   APPEARANCEUR Clear 08/06/2013 1043   LABSPEC 1.016 12/07/2017 1136   LABSPEC 1.015 08/06/2013 1043   PHURINE 5.0 12/07/2017 1136   GLUCOSEU NEGATIVE 12/07/2017 1136   GLUCOSEU Negative 08/06/2013 1043   HGBUR MODERATE (A) 12/07/2017 1136   BILIRUBINUR NEGATIVE 12/07/2017 1136   BILIRUBINUR Negative 08/06/2013 1043   KETONESUR 5 (A) 12/07/2017 1136   PROTEINUR 30 (A) 12/07/2017 1136   NITRITE POSITIVE (A) 12/07/2017 1136   LEUKOCYTESUR LARGE (A) 12/07/2017 1136   LEUKOCYTESUR Negative 08/06/2013 1043     RADIOLOGY: Ct Head Wo Contrast  Result Date: 12/07/2017 CLINICAL DATA:  Altered level of consciousness. EXAM: CT HEAD WITHOUT CONTRAST TECHNIQUE: Contiguous axial images were obtained from the base of the skull through the vertex without intravenous contrast. COMPARISON:  CT scan of March 04, 2015. FINDINGS: Brain: Mild diffuse cortical atrophy. Mild chronic ischemic white matter disease. No mass effect or midline shift is noted. Ventricular size is within normal limits. There is no evidence of mass lesion, hemorrhage or acute infarction. Vascular: No hyperdense vessel or unexpected calcification. Skull: Normal. Negative for fracture or focal lesion.  Sinuses/Orbits: Right maxillary and bilateral ethmoid sinusitis is noted. Other: None. IMPRESSION: Right maxillary and bilateral ethmoid sinusitis. Mild diffuse cortical atrophy. Mild chronic ischemic white matter disease. No acute intracranial abnormality seen. Electronically Signed   By: Marijo Conception, M.D.   On: 12/07/2017 12:08   Dg Chest Port 1 View  Result Date: 12/07/2017 CLINICAL DATA:  Intubation. EXAM: PORTABLE CHEST 1 VIEW COMPARISON:  03/04/2015. FINDINGS: Endotracheal tube tip noted 3.4 cm above the carina. Heart size normal. Mild basilar atelectasis. No pleural effusion or pneumothorax. No acute bony abnormality. IMPRESSION: 1.  Endotracheal tube tip noted 3.4 cm above the carina. 2.  Mild bibasilar atelectasis. Electronically Signed   By: Marcello Moores  Register   On: 12/07/2017 12:33    EKG: Orders placed or performed during the hospital encounter of 12/07/17  . ED EKG  . ED EKG  . EKG 12-Lead  . EKG 12-Lead    IMPRESSION AND PLAN: *Acute comatose state due to acute hypoxic respiratory failure *Acute hypoxic respiratory failure *Acute probable urinary tract infection *Acute dehydration *Abnormal acute EKG *Chronic Parkinson's disease with dementia/chronic ataxia  Admit to ICU, case discussed with intensivist, continue mechanical ventilation, check ammonia level, RPR, check MRI of the brain for further evaluation, rule out acute coronary syndrome with cardiac enzymes x3 sets, consult cardiology given abnormal EKG findings, empiric Rocephin for 3-day course and follow-up on cultures, IV fluids for rehydration, aspiration/fall/skin care precautions while in house, neurochecks per routine, and continue close medical monitoring  All the records are reviewed and case discussed with ED provider. Management plans discussed with the patient, family and they are in agreement.  CODE STATUS:full   TOTAL TIME TAKING CARE OF THIS PATIENT: 45 minutes.    Avel Peace Adam Sanjuan M.D on  12/07/2017   Between 7am to 6pm - Pager - [phone]  After 6pm go to [email or web address] - password [email or web address] [facility]  Office  [phone]  CC: Primary care physician; Lavera Guise, MD   Note: This dictation was prepared with Dragon dictation along with smaller phrase technology. Any transcriptional errors that result from this process are unintentional.

## 2017-12-07 NOTE — Progress Notes (Signed)
RN spoke to MRI tech, Larkin Ina who states that it will be several more hours before able to do MRI.  Report given to receiving RN, Josh with no further questions.

## 2017-12-07 NOTE — Progress Notes (Signed)
RN notified Corene Cornea, Pharmacist that Dr Jefferson Fuel would like to change whatever PO medication that can be changed to IV since patient does not have OG/NG at this time.   Per Corene Cornea, will speak to Nicki Reaper to see what can be done.

## 2017-12-07 NOTE — Consult Note (Signed)
Pharmacy Antibiotic Note  Kelly Brady is a 84 y.o. female admitted on 12/07/2017 with Resp failure and  sepsis.  Pharmacy has been consulted for Vancomycin dosing. Patient is already receiving ceftriaxone.   Plan: Ke: 0.028   T1/2: 25   Vd: 43.75  Vancomycin 1000 IV every 36 hours with 12 hour stack dosing.  Goal trough 15-20 mcg/mL. Calculated trough @ Css is 16. Will order trough level prior to 4th dose.   Height: 5\' 3"  (160 cm) Weight: 137 lb 12.6 oz (62.5 kg) IBW/kg (Calculated) : 52.4  Temp (24hrs), Avg:96.3 F (35.7 C), Min:94.6 F (34.8 C), Max:97.7 F (36.5 C)  Recent Labs  Lab 12/07/17 1142  WBC 9.2  CREATININE 1.26*    Estimated Creatinine Clearance: 27 mL/min (A) (by C-G formula based on SCr of 1.26 mg/dL (H)).    Allergies  Allergen Reactions  . Latex Anaphylaxis    Antimicrobials this admission: 8/22 vancomycin  >>  8/22 ceftriaxone  >>   Microbiology results: 8/22 BCx: pending 8/22 UCx: pending 8/22  MRSA PCR: pending  Thank you for allowing pharmacy to be a part of this patient's care.  Pernell Dupre, PharmD, BCPS Clinical Pharmacist 12/07/2017 3:58 PM

## 2017-12-07 NOTE — Progress Notes (Signed)
RN called Lab, spoke to Kelly Brady regarding patient's with lab due at 1520 and there were lab tech here who were drawing her labs but there are no result in computer.  Per Vincente Liberty, will send a lab tech here to investigate.

## 2017-12-07 NOTE — ED Provider Notes (Signed)
[facility] Emergency Department Provider Note  ____________________________________________   First MD Initiated Contact with Patient 12/07/17 1133     (approximate)  I have reviewed the triage vital signs and the nursing notes.   HISTORY  Chief Complaint Respiratory Distress  Level 5 exemption history limited by the patient's critical condition  HPI Kelly Brady is a 84 y.o. female who comes to the emergency department via EMS after being found unresponsive by her family.  According to EMS the patient was walking outside with her family when she collapsed to the ground and was minimally responsive.  Family director inside and called 911.  When EMS arrived they noted that she had small pupils and they administered Narcan with no improvement.  She was bagged in route.    Past Medical History:  Diagnosis Date  . Closed fracture of unspecified part of vertebral column without mention of spinal cord injury   . Dementia without behavioral disturbance 01/11/2013  . Disturbance of skin sensation   . Lumbago   . Paralysis agitans ([place])   . Rheumatoid arthritis(714.0)   . Unspecified vitamin D deficiency     Patient Active Problem List   Diagnosis Date Noted  . 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 Arthritis, degenerative 10/01/2013  . Atrophic vaginitis 10/01/2013  . BP (high blood pressure) 10/01/2013  . Cannot sleep 10/01/2013  . Clinical depression 10/01/2013  . DD (diverticular disease) 10/01/2013  . Headache, migraine 10/01/2013  . History of colon polyps 10/01/2013  . HLD (hyperlipidemia) 10/01/2013  . OP (osteoporosis) 10/01/2013  . Parkinson's disease ([place]) 10/01/2013  . Dementia without behavioral disturbance 01/11/2013    Past Surgical History:  Procedure Laterality Date  . CATARACT EXTRACTION Right 2013  . LUMBAR SPINE SURGERY  2008  . MELANOMA EXCISION Left 2013   arm  . VERTEBROPLASTY  2011    Prior to Admission medications    Medication Sig Start Date End Date Taking? Authorizing Provider  amitriptyline (ELAVIL) 10 MG tablet Take 10 mg by mouth at bedtime. 1/2 tablet daily    [provider]  CALCIUM PO Take 1 tablet by mouth 2 (two) times daily.     [provider]  carbidopa-levodopa (SINEMET CR) 50-200 MG tablet Take 1/2 tab in morning, 1/2 tab at lunch, and 1 tab at bedtime. 08/28/17   Ronnell Freshwater, NP  diltiazem (CARDIZEM CD) 120 MG 24 hr capsule Take 1 capsule by mouth daily. 09/16/13   [provider]  escitalopram (LEXAPRO) 10 MG tablet TAKE 1 TABLET BY MOUTH AT BEDTIME 08/28/17   Lavera Guise, MD  FLUZONE HIGH-DOSE 0.5 ML SUSY  12/31/13   [provider]  lansoprazole (PREVACID) 30 MG capsule Take 1 capsule by mouth daily. 09/23/13   [provider]  pramipexole (MIRAPEX) 0.25 MG tablet Take 0.25 mg by mouth 2 (two) times daily.    [provider]  PREVIDENT 5000 DRY MOUTH 1.1 % GEL dental gel  12/31/13   [provider]  solifenacin (VESICARE) 10 MG tablet Take 10 mg by mouth daily.    [provider]    Allergies Latex  Family History  Problem Relation Age of Onset  . Kidney failure Mother   . Stroke Father     Social History Social History   Tobacco Use  . Smoking status: Former Smoker    Types: Cigarettes    Last attempt to quit: 09/12/1962    Years since quitting: 55.2  Substance Use  Topics  . Alcohol use: No  . Drug use: No    Review of Systems Level 5 exemption history limited by the patient's critical illness ____________________________________________   PHYSICAL EXAM:  VITAL SIGNS: ED Triage Vitals [12/07/17 1131]  Enc Vitals Group     BP 100/63     Pulse Rate 85     Resp 16     Temp      Temp src      SpO2 100 %     Weight      Height      Head Circumference      Peak Flow      Pain Score      Pain Loc      Pain Edu?      Excl. in [place]?     Constitutional: Being actively bagged.  Minimally  responsive to pain.  Is moving all 4 extremities Eyes: PERRL EOMI. pupils are 2 mm and sluggish Head: Atraumatic. Nose: No congestion/rhinnorhea. Mouth/Throat: No trismus Neck: No stridor.   Cardiovascular: Tachycardic rate, regular rhythm. Grossly normal heart sounds.  Good peripheral circulation. Respiratory: Decreased respiratory effort.  Coarse breath sounds with bagging Gastrointestinal: Soft nontender Musculoskeletal: No lower extremity edema   Neurologic: Moves all 4 extremities Skin:  Skin is warm, dry and intact. No rash noted. Psychiatric: Obtunded   ____________________________________________   DIFFERENTIAL includes but not limited to  Intercerebral hemorrhage, metabolic derangement, sepsis ____________________________________________   LABS (all labs ordered are listed, but only abnormal results are displayed)  Labs Reviewed  COMPREHENSIVE METABOLIC PANEL - Abnormal; Notable for the following components:      Result Value   BUN 33 (*)    Creatinine, Ser 1.26 (*)    GFR calc non Af Amer 38 (*)    GFR calc Af Amer 44 (*)    All other components within normal limits  ACETAMINOPHEN LEVEL - Abnormal; Notable for the following components:   Acetaminophen (Tylenol), Serum <10 (*)    All other components within normal limits  URINALYSIS, COMPLETE (UACMP) WITH MICROSCOPIC - Abnormal; Notable for the following components:   Color, Urine YELLOW (*)    APPearance CLOUDY (*)    Hgb urine dipstick MODERATE (*)    Ketones, ur 5 (*)    Protein, ur 30 (*)    Nitrite POSITIVE (*)    Leukocytes, UA LARGE (*)    RBC / HPF >50 (*)    WBC, UA >50 (*)    Bacteria, UA RARE (*)    All other components within normal limits  BLOOD GAS, ARTERIAL - Abnormal; Notable for the following components:   pH, Arterial 7.62 (*)    pCO2 arterial 21 (*)    pO2, Arterial 192 (*)    Allens test (pass/fail) POSITIVE (*)    All other components within normal limits  ETHANOL  SALICYLATE LEVEL    CBC WITH DIFFERENTIAL/PLATELET  TROPONIN I  URINE DRUG SCREEN, QUALITATIVE ([facility] ONLY)    Lab work reviewed by me consistent with urinary tract infection as well as respiratory alkalosis __________________________________________  EKG  ED ECG REPORT I, Darel Hong, the attending physician, personally viewed and interpreted this ECG.  Date: 12/09/2017 EKG Time:  Rate: 79 Rhythm: normal sinus rhythm QRS Axis: Rightward axis Intervals: normal ST/T Wave abnormalities: normal Narrative Interpretation: no evidence of acute ischemia  ____________________________________________  RADIOLOGY  Chest x-ray reviewed by me shows ET tube in good position Head CT reviewed by me with no acute disease  ____________________________________________   PROCEDURES  Procedure(s) performed: Yes  .Critical Care Performed by: Darel Hong, MD Authorized by: Darel Hong, MD   Critical care provider statement:    Critical care time (minutes):  40   Critical care time was exclusive of:  Separately billable procedures and treating other patients   Critical care was necessary to treat or prevent imminent or life-threatening deterioration of the following conditions:  Respiratory failure   Critical care was time spent personally by me on the following activities:  Development of treatment plan with patient or surrogate, discussions with consultants, evaluation of patient's response to treatment, examination of patient, obtaining history from patient or surrogate, ordering and performing treatments and interventions, ordering and review of laboratory studies, ordering and review of radiographic studies, pulse oximetry, re-evaluation of patient's condition and review of old charts Procedure Name: Intubation Date/Time: 12/07/2017 11:45 AM Performed by: Darel Hong, MD Pre-anesthesia Checklist: Patient identified, Patient being monitored, Emergency Drugs available, Timeout performed and  Suction available Oxygen Delivery Method: Non-rebreather mask Preoxygenation: Pre-oxygenation with 100% oxygen Induction Type: Rapid sequence Ventilation: Mask ventilation without difficulty Laryngoscope Size: Mac and 4 Grade View: Grade I Tube size: 7.5 mm Number of attempts: 1 Placement Confirmation: ETT inserted through vocal cords under direct vision,  CO2 detector and Breath sounds checked- equal and bilateral Secured at: 22 cm Tube secured with: ETT holder       Critical Care performed: Yes  ____________________________________________   INITIAL IMPRESSION / ASSESSMENT AND PLAN / ED COURSE  Pertinent labs & imaging results that were available during my care of the patient were reviewed by me and considered in my medical decision making (see chart for details).   As part of my medical decision making, I reviewed the following data within the [place] History obtained from family if available, nursing notes, old chart and ekg, as well as notes from prior ED visits.  The patient arrives critically ill and obtunded minimally responsive and being bagged by EMS.  Blood sugar was unremarkable.  The patient is clearly not tolerating her own secretions.  She is able to move all 4 extremities and I do not believe this is an ischemic stroke.  Differential is broad but includes metabolic derangement versus intracerebral hemorrhage versus infection.  She was emergently intubated with ketamine and rocuronium without complication and taken to head CT.  CT scan is fortunately reassuring.  Her urinalysis is concerning for infection which could be the etiology of her encephalopathy.  I reviewed previous cultures and she has none on file so I will cover her with ceftriaxone.  Family at bedside updated and agrees with the plan.  She is admitted to our ICU in critical but stable condition.      ____________________________________________   FINAL CLINICAL IMPRESSION(S) / ED  DIAGNOSES  Final diagnoses:  Respiratory distress  Altered mental status, unspecified altered mental status type      NEW MEDICATIONS STARTED DURING THIS VISIT:  New Prescriptions   No medications on file     Note:  This document was prepared using Dragon voice recognition software and may include unintentional dictation errors.     Darel Hong, MD 12/09/17 2206

## 2017-12-07 NOTE — Progress Notes (Signed)
RN notified Dr Jefferson Fuel that ED staff and this RN was unable to place OG/NG tube.  MD states y to request for IR placement of NG tomorrow.  Regarding PO meds now, to have Pharmacy change her PO meds to IV.   RN notified MD that MRI has not been done.  RN clarify with MD that patient's on fentanyl and diprivan for sedation but diprivan is only ordered once.  MD states "she only needs one, you can discontinue the diprivan."

## 2017-12-07 NOTE — ED Notes (Addendum)
MD Rifenbark attempted x3 for OG and x1 for NG with no success.

## 2017-12-07 NOTE — ED Triage Notes (Signed)
Pt from home by EMS found unreponsive by family outside. PT was responsive to pain after narcan given by EMS, pt had aprox 5 RR/min upon arrival. PT hx of dementia and parkinsons. EMS asst ventilating in route. MD at bedside

## 2017-12-08 ENCOUNTER — Inpatient Hospital Stay: Payer: Medicare Other

## 2017-12-08 DIAGNOSIS — J9600 Acute respiratory failure, unspecified whether with hypoxia or hypercapnia: Secondary | ICD-10-CM

## 2017-12-08 LAB — BLOOD GAS, ARTERIAL
Acid-base deficit: 5.4 mmol/L — ABNORMAL HIGH (ref 0.0–2.0)
Bicarbonate: 21.6 mmol/L (ref 20.0–28.0)
FIO2: 0.35
MECHVT: 500 mL
O2 Saturation: 98.3 %
PEEP: 5 cmH2O
Patient temperature: 37
RATE: 12 resp/min
pCO2 arterial: 47 mmHg (ref 32.0–48.0)
pH, Arterial: 7.27 — ABNORMAL LOW (ref 7.350–7.450)
pO2, Arterial: 124 mmHg — ABNORMAL HIGH (ref 83.0–108.0)

## 2017-12-08 LAB — CBC
HEMATOCRIT: 35 % (ref 35.0–47.0)
Hemoglobin: 12.1 g/dL (ref 12.0–16.0)
MCH: 30.6 pg (ref 26.0–34.0)
MCHC: 34.5 g/dL (ref 32.0–36.0)
MCV: 88.9 fL (ref 80.0–100.0)
Platelets: 261 10*3/uL (ref 150–440)
RBC: 3.94 MIL/uL (ref 3.80–5.20)
RDW: 13.9 % (ref 11.5–14.5)
WBC: 12.3 10*3/uL — ABNORMAL HIGH (ref 3.6–11.0)

## 2017-12-08 LAB — BLOOD CULTURE ID PANEL (REFLEXED)
ACINETOBACTER BAUMANNII: NOT DETECTED
CANDIDA ALBICANS: NOT DETECTED
CANDIDA GLABRATA: NOT DETECTED
CANDIDA KRUSEI: NOT DETECTED
CANDIDA PARAPSILOSIS: NOT DETECTED
Candida tropicalis: NOT DETECTED
ENTEROBACTERIACEAE SPECIES: NOT DETECTED
ESCHERICHIA COLI: NOT DETECTED
Enterobacter cloacae complex: NOT DETECTED
Enterococcus species: NOT DETECTED
Haemophilus influenzae: NOT DETECTED
KLEBSIELLA OXYTOCA: NOT DETECTED
Klebsiella pneumoniae: NOT DETECTED
Listeria monocytogenes: NOT DETECTED
Methicillin resistance: NOT DETECTED
Neisseria meningitidis: NOT DETECTED
PSEUDOMONAS AERUGINOSA: NOT DETECTED
Proteus species: NOT DETECTED
STREPTOCOCCUS PNEUMONIAE: NOT DETECTED
STREPTOCOCCUS PYOGENES: NOT DETECTED
Serratia marcescens: NOT DETECTED
Staphylococcus aureus (BCID): NOT DETECTED
Staphylococcus species: DETECTED — AB
Streptococcus agalactiae: NOT DETECTED
Streptococcus species: NOT DETECTED

## 2017-12-08 LAB — BASIC METABOLIC PANEL
Anion gap: 6 (ref 5–15)
BUN: 29 mg/dL — AB (ref 8–23)
CALCIUM: 8.4 mg/dL — AB (ref 8.9–10.3)
CO2: 23 mmol/L (ref 22–32)
CREATININE: 1.04 mg/dL — AB (ref 0.44–1.00)
Chloride: 109 mmol/L (ref 98–111)
GFR calc Af Amer: 55 mL/min — ABNORMAL LOW (ref >60)
GFR calc non Af Amer: 48 mL/min — ABNORMAL LOW (ref >60)
GLUCOSE: 99 mg/dL (ref 70–99)
Potassium: 3.2 mmol/L — ABNORMAL LOW (ref 3.5–5.1)
Sodium: 138 mmol/L (ref 135–145)

## 2017-12-08 LAB — TROPONIN I
Troponin I: <0.03 ng/mL (ref <0.03)
Troponin I: <0.03 ng/mL (ref <0.03)

## 2017-12-08 LAB — RPR: RPR: NONREACTIVE

## 2017-12-08 LAB — LACTIC ACID, PLASMA: Lactic Acid, Venous: 2.4 mmol/L (ref 0.5–1.9)

## 2017-12-08 MED ORDER — ENOXAPARIN SODIUM 40 MG/0.4ML ~~LOC~~ SOLN
40.0000 mg | SUBCUTANEOUS | Status: DC
  Administered 2017-12-08 – 2017-12-12 (×5): 40 mg via SUBCUTANEOUS
  Filled 2017-12-08 (×5): qty 0.4

## 2017-12-08 MED ORDER — DEXMEDETOMIDINE HCL IN NACL 400 MCG/100ML IV SOLN
0.4000 ug/kg/h | INTRAVENOUS | Status: DC
  Administered 2017-12-08: 0.4 ug/kg/h via INTRAVENOUS
  Filled 2017-12-08: qty 100

## 2017-12-08 MED ORDER — PHENYLEPHRINE HCL-NACL 10-0.9 MG/250ML-% IV SOLN
0.0000 ug/min | INTRAVENOUS | Status: DC
  Filled 2017-12-08: qty 250

## 2017-12-08 MED ORDER — SODIUM CHLORIDE 0.9 % IV SOLN
0.0000 ug/min | INTRAVENOUS | Status: DC
  Administered 2017-12-08: 8 ug/min via INTRAVENOUS
  Administered 2017-12-08: 100 ug/min via INTRAVENOUS
  Administered 2017-12-09: 15 ug/min via INTRAVENOUS
  Filled 2017-12-08 (×2): qty 1
  Filled 2017-12-08: qty 10
  Filled 2017-12-08: qty 1

## 2017-12-08 MED ORDER — POTASSIUM CHLORIDE 10 MEQ/100ML IV SOLN
10.0000 meq | INTRAVENOUS | Status: AC
  Administered 2017-12-08 (×4): 10 meq via INTRAVENOUS
  Filled 2017-12-08 (×4): qty 100

## 2017-12-08 NOTE — Progress Notes (Signed)
Initial Nutrition Assessment  DOCUMENTATION CODES:   Not applicable  INTERVENTION:   If tube feeds initiated recommend Vital 1.2 @40ml /hr + Prostat 69ml via tube daily  Free water flushes 70ml q 4 hrs  Regimen provides 1252kcal/day, 87g/day protein, 1270ml/day free water   Recommend liquid MVI daily via tube   Recommend B-complex with C daily via tube   NUTRITION DIAGNOSIS:   Inadequate oral intake related to acute illness(pt sedated and ventilated ) as evidenced by NPO status.  GOAL:   Provide needs based on ASPEN/SCCM guidelines  MONITOR:   Vent status, Labs, Weight trends, TF tolerance, Skin, I & O's  REASON FOR ASSESSMENT:   Ventilator    ASSESSMENT:   84 y.o. female with a past medical history remarkable for dementia, rheumatoid arthritis, Parkinson's disease with chronic ataxia   Pt sedated and ventilated. No family at bedside. IR unable to place NGT today. No access for feeding. Pt to have MRI today. Pt noted to have bruising on bilateral arms; recommend B-complex with C supplementation when able. No recent weight history in chart; suspect moderate refeeding risk.   Medications reviewed and include: sinemet, lovenox, ceftriaxone, precedex, pepcid, fentanyl, neo-synephrine, KCl  Labs reviewed: K 3.2(L), BUN 29(H), creat 1.04(H) Wbc- 12.3(H)  Patient is currently intubated on ventilator support MV: 7.9 L/min Temp (24hrs), Avg:97.5 F (36.4 C), Min:96.4 F (35.8 C), Max:98.4 F (36.9 C)  Propofol: none   MAP- >52mmHg  UOP- 612ml x 24 hrs  Nutrition-Focused physical exam completed. Findings are no fat depletion, no muscle depletion, and no edema.   Diet Order:   Diet Order            Diet NPO time specified Except for: Other (See Comments)  Diet effective now             EDUCATION NEEDS:   Not appropriate for education at this time  Skin:  Skin Assessment: Reviewed RN Assessment(MASD)  Last BM:  pta  Height:   Ht Readings from Last 1  Encounters:  12/07/17 5\' 3"  (1.6 m)    Weight:   Wt Readings from Last 1 Encounters:  12/08/17 66.1 kg    Ideal Body Weight:  52.3 kg  BMI:  Body mass index is 25.81 kg/m.  Estimated Nutritional Needs:   Kcal:  1233kcal/day   Protein:  78-89g/day   Fluid:  >1.3L/day   Koleen Distance MS, RD, LDN Pager #- [phone] Office#- [phone] After Hours Pager: [phone]

## 2017-12-08 NOTE — Progress Notes (Signed)
[facility] NAME: Kelly Brady    MR#:  [id number]  DATE OF BIRTH:  04-Apr-1931  SUBJECTIVE: Patient is admitted for syncope, collapse, unresponsiveness, intubated for airway protection, admitted to intensive care unit.  Found to have UTI.  CHIEF COMPLAINT:   Chief Complaint  Patient presents with  . Respiratory Distress    REVIEW OF SYSTEMS:   Review of Systems  Unable to perform ROS: Intubated      DRUG ALLERGIES:   Allergies  Allergen Reactions  . Latex Anaphylaxis    VITALS:  Blood pressure (!) 112/58, pulse 72, temperature 97.9 F (36.6 C), temperature source Oral, resp. rate 16, height 5\' 3"  (1.6 m), weight 66.1 kg, SpO2 100 %.  PHYSICAL EXAMINATION:  GENERAL:  84 y.o.-year-old patient seen in ICU, intubated and sedated and appears critically ill. EYES: Pupils equal, round, reactive to light, No scleral icterus.  HEENT: Head atraumatic, normocephalic. Oropharynx and nasopharynx clear.  NECK:  Supple, no jugular venous distention. No thyroid enlargement, no tenderness.  LUNGS: Diminished breath sounds bilaterally.,  Patient has coarse rhonchi bilaterally. CARDIOVASCULAR: S1, S2 normal. No murmurs, rubs, or gallops.  ABDOMEN: Soft, nontender, nondistended. Bowel sounds present. No organomegaly or mass.  EXTREMITIES: No pedal edema, cyanosis, or clubbing.  NEUROLOGIC:  Limited because of intubation, sedation.,  Appears like she does not have any gross neurological deficit.  PSYCHIATRIC:  intubated, sedated.  SKIN: No obvious rash, lesion, or ulcer.    LABORATORY PANEL:   CBC Recent Labs  Lab 12/08/17 0457  WBC 12.3*  HGB 12.1  HCT 35.0  PLT 261   ------------------------------------------------------------------------------------------------------------------  Chemistries  Recent Labs  Lab 12/07/17 1142 12/08/17 0457  NA 137 138  K 3.5 3.2*  CL 104 109  CO2 26 23  GLUCOSE 98 99  BUN 33*  29*  CREATININE 1.26* 1.04*  CALCIUM 9.0 8.4*  AST 18  --   ALT <5  --   ALKPHOS 52  --   BILITOT 0.5  --    ------------------------------------------------------------------------------------------------------------------  Cardiac Enzymes Recent Labs  Lab 12/08/17 0457  TROPONINI <0.03   ------------------------------------------------------------------------------------------------------------------  RADIOLOGY:  Ct Head Wo Contrast  Result Date: 12/07/2017 CLINICAL DATA:  Altered level of consciousness. EXAM: CT HEAD WITHOUT CONTRAST TECHNIQUE: Contiguous axial images were obtained from the base of the skull through the vertex without intravenous contrast. COMPARISON:  CT scan of March 04, 2015. FINDINGS: Brain: Mild diffuse cortical atrophy. Mild chronic ischemic white matter disease. No mass effect or midline shift is noted. Ventricular size is within normal limits. There is no evidence of mass lesion, hemorrhage or acute infarction. Vascular: No hyperdense vessel or unexpected calcification. Skull: Normal. Negative for fracture or focal lesion. Sinuses/Orbits: Right maxillary and bilateral ethmoid sinusitis is noted. Other: None. IMPRESSION: Right maxillary and bilateral ethmoid sinusitis. Mild diffuse cortical atrophy. Mild chronic ischemic white matter disease. No acute intracranial abnormality seen. Electronically Signed   By: Marijo Conception, M.D.   On: 12/07/2017 12:08   Dg Chest Port 1 View  Result Date: 12/07/2017 CLINICAL DATA:  Intubation. EXAM: PORTABLE CHEST 1 VIEW COMPARISON:  03/04/2015. FINDINGS: Endotracheal tube tip noted 3.4 cm above the carina. Heart size normal. Mild basilar atelectasis. No pleural effusion or pneumothorax. No acute bony abnormality. IMPRESSION: 1.  Endotracheal tube tip noted 3.4 cm above the carina. 2.  Mild bibasilar atelectasis. Electronically Signed   By: Marcello Moores  Register   On: 12/07/2017 12:33  EKG:   Orders placed or performed during  the hospital encounter of 12/07/17  . ED EKG  . ED EKG  . EKG 12-Lead  . EKG 12-Lead    ASSESSMENT AND PLAN:  Acute respiratory failure prophylactically intubated in the emergency room because of unresponsiveness: Patient is on full vent support, continue full vent support, bronchodilators, chest x-ray showed mild bibasilar atelectasis, continue IV antibiotics.  Continue full vent support with sedation. Septic shock with evidence of elevated lactic acid up to 2.4, renal failure, patient is on pressors, IV fluids, WBCs elevated at 12.3, monitor closely.  Source likely due to UTI, pneumonia.  Patient is on vancomycin, Rocephin.  2.  Syncope, collapse, cardiac work-up including troponins have been negative, CT head did not show acute change.  MRI of the brain is ordered. Lethargy altered mental status with metabolic encephalopathy likely due to UTI, UA is grossly abnormal on admission, follow urine cultures, continue Rocephin,, vancomycin.  3 history of Parkinson, Alzheimer's dementia with behavioral disturbances, patient is on Sinemet, Mirapex . #4 hypokalemia, replace the potassium.  All the records are reviewed and case discussed with Care Management/Social Workerr. Management plans discussed with the patient, family and they are in agreement.  CODE STATUS: Full code  TOTAL TIME TAKING CARE OF THIS PATIENT: 35 minutes.   POSSIBLE D/C IN 3-4 DAYS, DEPENDING ON CLINICAL CONDITION.   Epifanio Lesches M.D on 12/08/2017 at 10:15 AM  Between 7am to 6pm - Pager - [phone]  After 6pm go to [email or web address] - password [email or web address] [facility]  Office  [phone]  CC: Primary care physician; Lavera Guise, MD   Note: This dictation was prepared with Dragon dictation along with smaller phrase technology. Any transcriptional errors that result from this process are unintentional.

## 2017-12-08 NOTE — Progress Notes (Signed)
PHARMACIST - PHYSICIAN COMMUNICATION  CONCERNING:  Enoxaparin (Lovenox) for DVT Prophylaxis    RECOMMENDATION: Patient was ordered enoxaprin 30mg  q24 hours for VTE prophylaxis.   Filed Weights   12/07/17 1525 12/08/17 0500  Weight: 137 lb 12.6 oz (62.5 kg) 145 lb 11.6 oz (66.1 kg)    Body mass index is 25.81 kg/m.  Estimated Creatinine Clearance: 36.1 mL/min (A) (by C-G formula based on SCr of 1.04 mg/dL (H)).  CrCl  Has improved. Based on [place] patient is candidate for enoxaparin 40mg  every 24 hours  DESCRIPTION: Pharmacy has adjusted enoxaparin dose per [place], approved through [place] committee.  Patient is now receiving enoxaparin 40mg  every 24 hours.   Pernell Dupre, PharmD, BCPS Clinical Pharmacist 12/08/2017 8:31 AM

## 2017-12-08 NOTE — Progress Notes (Signed)
Follow up - Critical Care Medicine Note  Patient Details:    Kelly Brady is an 84 y.o. female with a past medical history remarkable for dementia,rheumatoid arthritis,Parkinson's disease with chronic ataxia. Was being assisted by her family when she collapsed to the floor. Patient became unresponsive, brought into the emergency department and she was intubated for airway protection. Workup in the emergency room department showed positive urinalysis, creatinine 1.2, CT scan of the head was negative for acute intracranial abnormality, EKG revealed sinus mechanism with incomplete right bundle branch block,BUN was 33   Lines, Airways, Drains: Airway (Active)  Secured at (cm) 22 cm 12/08/2017  7:50 AM  Measured From Lips 12/08/2017  7:50 AM  Secured Location Left 12/08/2017  7:50 AM  Secured By [employer] 12/08/2017  7:50 AM  Tube Holder Repositioned Yes 12/07/2017  8:02 PM  Cuff Pressure (cm H2O) 26 cm H2O 12/07/2017  8:02 PM  Site Condition Dry 12/08/2017  7:50 AM     Urethral Catheter scott NT Non-latex 16 Fr. (Active)  Indication for Insertion or Continuance of Catheter Unstable critical patients (first 24-48 hours) 12/08/2017 12:00 AM  Site Assessment Clean;Intact 12/08/2017 12:00 AM  Catheter Maintenance Bag below level of bladder;Catheter secured;Drainage bag/tubing not touching floor;Insertion date on drainage bag;No dependent loops;Seal intact 12/08/2017 12:00 AM  Collection Container Standard drainage bag 12/08/2017 12:00 AM  Securement Method Securing device (Describe) 12/08/2017 12:00 AM  Urinary Catheter Interventions Unclamped 12/07/2017  8:00 PM  Output (mL) 270 mL 12/08/2017  6:20 AM    Anti-infectives:  Anti-infectives (From admission, onward)   Start     Dose/Rate Route Frequency Ordered Stop   12/08/17 1300  cefTRIAXone (ROCEPHIN) 1 g in sodium chloride 0.9 % 100 mL IVPB     1 g 200 mL/hr over 30 Minutes Intravenous Every 24 hours 12/07/17 1519 12/11/17 1259   12/08/17 0600  vancomycin (VANCOCIN) IVPB 1000 mg/200 mL premix     1,000 mg 200 mL/hr over 60 Minutes Intravenous Every 36 hours 12/07/17 1556     12/07/17 1600  vancomycin (VANCOCIN) IVPB 1000 mg/200 mL premix     1,000 mg 200 mL/hr over 60 Minutes Intravenous  Once 12/07/17 1556 12/07/17 1710   12/07/17 1315  cefTRIAXone (ROCEPHIN) 1 g in sodium chloride 0.9 % 100 mL IVPB     1 g 200 mL/hr over 30 Minutes Intravenous  Once 12/07/17 1302 12/07/17 1355      Microbiology: Results for orders placed or performed during the hospital encounter of 12/07/17  MRSA PCR Screening     Status: None   Collection Time: 12/07/17  3:27 PM  Result Value Ref Range Status   MRSA by PCR NEGATIVE NEGATIVE Final    Comment:        The GeneXpert MRSA Assay (FDA approved for NASAL specimens only), is one component of a comprehensive MRSA colonization surveillance program. It is not intended to diagnose MRSA infection nor to guide or monitor treatment for MRSA infections. Performed at [facility], [address] [place], [place] [address]   Culture, blood (routine x 2)     Status: None (Preliminary result)   Collection Time: 12/07/17  4:00 PM  Result Value Ref Range Status   Specimen Description BLOOD BLOOD LEFT WRIST  Final   Special Requests   Final    BOTTLES DRAWN AEROBIC AND ANAEROBIC Blood Culture results may not be optimal due to an inadequate volume of blood received in culture bottles   Culture  Final    NO GROWTH < 24 HOURS Performed at [facility], [address] [place], [place] [address]    Report Status PENDING  Incomplete  Culture, blood (routine x 2)     Status: None (Preliminary result)   Collection Time: 12/07/17  4:22 PM  Result Value Ref Range Status   Specimen Description BLOOD LEFT HAND  Final   Special Requests   Final    BOTTLES DRAWN AEROBIC AND ANAEROBIC Blood Culture results may not be optimal due to an inadequate volume of blood received in  culture bottles   Culture   Final    NO GROWTH < 24 HOURS Performed at [facility], [address] [place], [place] [address]    Report Status PENDING  Incomplete    Studies: Ct Head Wo Contrast  Result Date: 12/07/2017 CLINICAL DATA:  Altered level of consciousness. EXAM: CT HEAD WITHOUT CONTRAST TECHNIQUE: Contiguous axial images were obtained from the base of the skull through the vertex without intravenous contrast. COMPARISON:  CT scan of March 04, 2015. FINDINGS: Brain: Mild diffuse cortical atrophy. Mild chronic ischemic white matter disease. No mass effect or midline shift is noted. Ventricular size is within normal limits. There is no evidence of mass lesion, hemorrhage or acute infarction. Vascular: No hyperdense vessel or unexpected calcification. Skull: Normal. Negative for fracture or focal lesion. Sinuses/Orbits: Right maxillary and bilateral ethmoid sinusitis is noted. Other: None. IMPRESSION: Right maxillary and bilateral ethmoid sinusitis. Mild diffuse cortical atrophy. Mild chronic ischemic white matter disease. No acute intracranial abnormality seen. Electronically Signed   By: Marijo Conception, M.D.   On: 12/07/2017 12:08   Dg Chest Port 1 View  Result Date: 12/07/2017 CLINICAL DATA:  Intubation. EXAM: PORTABLE CHEST 1 VIEW COMPARISON:  03/04/2015. FINDINGS: Endotracheal tube tip noted 3.4 cm above the carina. Heart size normal. Mild basilar atelectasis. No pleural effusion or pneumothorax. No acute bony abnormality. IMPRESSION: 1.  Endotracheal tube tip noted 3.4 cm above the carina. 2.  Mild bibasilar atelectasis. Electronically Signed   By: [place]   On: 12/07/2017 12:33    Consults: Treatment Team:  Bradly Bienenstock, NP   Subjective:    Overnight Issues: no significant issues overnight. Patient remains on mechanical ventilation. Pending MRI this morning  Objective:  Vital signs for last 24 hours: Temp:  [94.6 F (34.8 C)-98.1 F (36.7 C)]  98.1 F (36.7 C) (08/23 0400) Pulse Rate:  [67-101] 76 (08/23 0730) Resp:  [12-26] 16 (08/23 0730) BP: (60-174)/(40-110) 89/50 (08/23 0730) SpO2:  [86 %-100 %] 99 % (08/23 0750) FiO2 (%):  [35 %-50 %] 35 % (08/23 0750) Weight:  [62.5 kg-66.1 kg] 66.1 kg (08/23 0500)  Hemodynamic parameters for last 24 hours:    Intake/Output from previous day: 08/22 0701 - 08/23 0700 In: 1102.3 [I.V.:752.2; IV Piggyback:350] Out: 615 [Urine:615]  Intake/Output this shift: No intake/output data recorded.  Vent settings for last 24 hours: Vent Mode: PRVC FiO2 (%):  [35 %-50 %] 35 % Set Rate:  [12 bmp-16 bmp] 16 bmp Vt Set:  [500 mL] 500 mL PEEP:  [5 cmH20] 5 cmH20 Plateau Pressure:  [16 cmH20] 16 cmH20  Physical Exam:  Elderly female, presently orally intubated, is beginning to wake up, squeezes family's hands appropriately and moving extremities HEENT:           Trachea midline, no thyromegaly appreciated, no carotid bruits auscultated Cardiovascular:           Regular rate  and rhythm Pulmonary:      Coarse rhonchi Abdominal:      Positive bowel sounds, soft exam Extremities:     No clubbing cyanosis or edema noted Neurologic:      Limited exam, patient intubated, had received sedative medication. Appears to be moving all extremities Cutaneous:      Diffuse areas of bruising/ecchymosis   Assessment/Plan:   Patient with underlying history of Parkinson's disease/movement disorderwith chronic ataxia,Unclear etiology of altered mental status and respiratory distress with hypoxemic respiratory failure. On mechanical ventilation protocol. Patient is presently on vancomycin and Rocephin empirically. fter MRI will begin spontaneous awakening and breathing trials today  Altered mental status. May be secondary to Parkinson's/encephalopathy/initial CT scan of head revealed sinusitis but no acute intracranial abnormality. Pending MRI of brain this morning  Hypokalemia we'll replace  Prerenal  azotemia  Leukocytosis  Critical Care Total Time 35 minutes  Kemond Amorin 12/08/2017  *Care during the described time interval was provided by me and/or other providers on the critical care team.  I have reviewed this patient's available data, including medical history, events of note, physical examination and test results as part of my evaluation.

## 2017-12-09 DIAGNOSIS — N3000 Acute cystitis without hematuria: Secondary | ICD-10-CM

## 2017-12-09 LAB — BASIC METABOLIC PANEL
ANION GAP: 8 (ref 5–15)
BUN: 15 mg/dL (ref 8–23)
CALCIUM: 8.5 mg/dL — AB (ref 8.9–10.3)
CO2: 21 mmol/L — AB (ref 22–32)
Chloride: 106 mmol/L (ref 98–111)
Creatinine, Ser: 0.8 mg/dL (ref 0.44–1.00)
GFR calc non Af Amer: >60 mL/min (ref >60)
GLUCOSE: 103 mg/dL — AB (ref 70–99)
POTASSIUM: 3.7 mmol/L (ref 3.5–5.1)
Sodium: 135 mmol/L (ref 135–145)

## 2017-12-09 LAB — BLOOD GAS, ARTERIAL
ACID-BASE EXCESS: 1.8 mmol/L (ref 0.0–2.0)
Allens test (pass/fail): POSITIVE — AB
Bicarbonate: 21.6 mmol/L (ref 20.0–28.0)
FIO2: 0.5
LHR: 12 {breaths}/min
MECHVT: 500 mL
O2 Saturation: 99.8 %
PATIENT TEMPERATURE: 37
PEEP/CPAP: 5 cmH2O
PO2 ART: 192 mmHg — AB (ref 83.0–108.0)
pCO2 arterial: 21 mmHg — ABNORMAL LOW (ref 32.0–48.0)
pH, Arterial: 7.62 (ref 7.350–7.450)

## 2017-12-09 LAB — MAGNESIUM: Magnesium: 1.6 mg/dL — ABNORMAL LOW (ref 1.7–2.4)

## 2017-12-09 LAB — URINE CULTURE: Culture: >=100000 — AB

## 2017-12-09 LAB — PHOSPHORUS: PHOSPHORUS: 1.8 mg/dL — AB (ref 2.5–4.6)

## 2017-12-09 MED ORDER — ALBUTEROL SULFATE (2.5 MG/3ML) 0.083% IN NEBU: 2.5000 mg | INHALATION_SOLUTION | RESPIRATORY_TRACT | Status: DC | PRN

## 2017-12-09 MED ORDER — METOPROLOL TARTRATE 5 MG/5ML IV SOLN
2.5000 mg | INTRAVENOUS | Status: DC | PRN
  Administered 2017-12-10: 2.5 mg via INTRAVENOUS
  Filled 2017-12-09: qty 5

## 2017-12-09 MED ORDER — MAGNESIUM SULFATE 2 GM/50ML IV SOLN
2.0000 g | Freq: Once | INTRAVENOUS | Status: AC
  Administered 2017-12-09: 2 g via INTRAVENOUS
  Filled 2017-12-09: qty 50

## 2017-12-09 MED ORDER — ACETAMINOPHEN 650 MG RE SUPP
650.0000 mg | Freq: Four times a day (QID) | RECTAL | Status: DC | PRN
  Administered 2017-12-10: 650 mg via RECTAL
  Filled 2017-12-09 (×2): qty 1

## 2017-12-09 MED ORDER — SODIUM PHOSPHATES 45 MMOLE/15ML IV SOLN
30.0000 mmol | Freq: Once | INTRAVENOUS | Status: AC
  Administered 2017-12-09: 30 mmol via INTRAVENOUS
  Filled 2017-12-09: qty 10

## 2017-12-09 MED ORDER — LACTATED RINGERS IV SOLN
INTRAVENOUS | Status: DC
  Administered 2017-12-09: 12:00:00 via INTRAVENOUS

## 2017-12-09 NOTE — Progress Notes (Signed)
Passed SBT this morning and extubated under my supervision.  Comfortable on nasal cannula oxygen.  Conversant and F/C.  No distress  Vitals:   12/09/17 1330 12/09/17 1400 12/09/17 1430 12/09/17 1500  BP: 125/60 123/62 (!) 151/81 (!) 160/86  Pulse: (!) 106 (!) 107 (!) 116 (!) 111  Resp: 17 15 19  (!) 23  Temp:      TempSrc:      SpO2: 98% 94% 97% 91%  Weight:      Height:       Gen: NAD HEENT: NCAT, sclera white Neck: No JVD Lungs: breath sounds full, no wheezes or other adventitious sounds Cardiovascular: RRR, no murmurs Abdomen: Soft, nontender, normal BS Ext: without clubbing, cyanosis, edema Neuro: grossly intact Skin: Limited exam, no lesions noted  BMP Latest Ref Rng & Units 12/08/2017 12/07/2017 03/04/2015  Glucose 70 - 99 mg/dL 99 98 103(H)  BUN 8 - 23 mg/dL 29(H) 33(H) 11  Creatinine 0.44 - 1.00 mg/dL 1.04(H) 1.26(H) 0.83  Sodium 135 - 145 mmol/L 138 137 135  Potassium 3.5 - 5.1 mmol/L 3.2(L) 3.5 3.4(L)  Chloride 98 - 111 mmol/L 109 104 102  CO2 22 - 32 mmol/L 23 26 24   Calcium 8.9 - 10.3 mg/dL 8.4(L) 9.0 9.6   CBC Latest Ref Rng & Units 12/08/2017 12/07/2017 03/04/2015  WBC 3.6 - 11.0 K/uL 12.3(H) 9.2 6.6  Hemoglobin 12.0 - 16.0 g/dL 12.1 12.2 14.6  Hematocrit 35.0 - 47.0 % 35.0 35.0 42.4  Platelets 150 - 440 K/uL 261 253 242   CXR: No new film  IMPRESSION: Ventilator dependent respiratory failure.  Intubated for unresponsiveness.  Now extubated and appears to be tolerating  Parkinson's disease with dementia and chronic ataxia  Probable urinary tract infection  PLAN/REC: Extubated under my supervision this morning Monitor in ICU post extubation Supplemental oxygen as needed to maintain SPO2 >90% SLP evaluation requested Continue ceftriaxone for now Per family request, will have neurology see her 8/26 for reassessment of Parkinson's medications  Merton Border, MD PCCM service Mobile [phone] Pager [phone] 12/09/2017 3:42 PM

## 2017-12-09 NOTE — Progress Notes (Signed)
PHARMACY - PHYSICIAN COMMUNICATION CRITICAL VALUE ALERT - BLOOD CULTURE IDENTIFICATION (BCID)  Kelly Brady is an 84 y.o. female who presented to [facility] on [facility]07/2017 with a chief complaint of   Assessment:  1/4 Staph spp mec A(-) (include suspected source if known)  Name of physician (or Provider) Contacted: [place]    Current antibiotics: none  Changes to prescribed antibiotics recommended:  n/a  Results for orders placed or performed during the hospital encounter of 12/07/17  Blood Culture ID Panel (Reflexed) (Collected: 12/07/2017  4:22 PM)  Result Value Ref Range   Enterococcus species NOT DETECTED NOT DETECTED   Listeria monocytogenes NOT DETECTED NOT DETECTED   Staphylococcus species DETECTED (A) NOT DETECTED   Staphylococcus aureus NOT DETECTED NOT DETECTED   Methicillin resistance NOT DETECTED NOT DETECTED   Streptococcus species NOT DETECTED NOT DETECTED   Streptococcus agalactiae NOT DETECTED NOT DETECTED   Streptococcus pneumoniae NOT DETECTED NOT DETECTED   Streptococcus pyogenes NOT DETECTED NOT DETECTED   Acinetobacter baumannii NOT DETECTED NOT DETECTED   Enterobacteriaceae species NOT DETECTED NOT DETECTED   Enterobacter cloacae complex NOT DETECTED NOT DETECTED   Escherichia coli NOT DETECTED NOT DETECTED   Klebsiella oxytoca NOT DETECTED NOT DETECTED   Klebsiella pneumoniae NOT DETECTED NOT DETECTED   Proteus species NOT DETECTED NOT DETECTED   Serratia marcescens NOT DETECTED NOT DETECTED   Haemophilus influenzae NOT DETECTED NOT DETECTED   Neisseria meningitidis NOT DETECTED NOT DETECTED   Pseudomonas aeruginosa NOT DETECTED NOT DETECTED   Candida albicans NOT DETECTED NOT DETECTED   Candida glabrata NOT DETECTED NOT DETECTED   Candida krusei NOT DETECTED NOT DETECTED   Candida parapsilosis NOT DETECTED NOT DETECTED   Candida tropicalis NOT DETECTED NOT DETECTED    Isabel Ardila S 12/09/2017  3:44 AM

## 2017-12-09 NOTE — Plan of Care (Signed)
Patient responsive.  No discomfort noted. Remains ventilated. Eyes open. Attentive.  Good urine output.  Family in to visit.  Will continue to monitor.

## 2017-12-09 NOTE — Progress Notes (Signed)
[facility] [place] NAME: Caoimhe Damron    MR#:  [id number]  DATE OF BIRTH:  [birth year]  SUBJECTIVE:  CHIEF COMPLAINT:   Chief Complaint  Patient presents with  . Respiratory Distress  Patient seen and evaluated today Status post extubation Awake and responds to verbal commands No shortness of breath  REVIEW OF SYSTEMS:    ROS  CONSTITUTIONAL: No documented fever. No fatigue, weakness. No weight gain, no weight loss.  EYES: No blurry or double vision.  ENT: No tinnitus. No postnasal drip. No redness of the oropharynx.  RESPIRATORY: Decreased cough, no wheeze, no hemoptysis. No dyspnea.  CARDIOVASCULAR: No chest pain. No orthopnea. No palpitations. No syncope.  GASTROINTESTINAL: No nausea, no vomiting or diarrhea. No abdominal pain. No melena or hematochezia.  GENITOURINARY: No dysuria or hematuria.  ENDOCRINE: No polyuria or nocturia. No heat or cold intolerance.  HEMATOLOGY: No anemia. No bruising. No bleeding.  INTEGUMENTARY: No rashes. No lesions.  MUSCULOSKELETAL: No arthritis. No swelling. No gout.  NEUROLOGIC: No numbness, tingling, or ataxia. No seizure-type activity.  PSYCHIATRIC: No anxiety. No insomnia. No ADD.   DRUG ALLERGIES:   Allergies  Allergen Reactions  . Latex Anaphylaxis    VITALS:  Blood pressure (!) 156/75, pulse (!) 103, temperature 98.6 F (37 C), temperature source Oral, resp. rate 16, height 5\' 3"  (1.6 m), weight 67.8 kg, SpO2 98 %.  PHYSICAL EXAMINATION:   Physical Exam  GENERAL:  84 y.o.-year-old patient lying in the bed with no acute distress.  EYES: Pupils equal, round, reactive to light and accommodation. No scleral icterus. Extraocular muscles intact.  HEENT: Head atraumatic, normocephalic. Oropharynx and nasopharynx clear.  NECK:  Supple, no jugular venous distention. No thyroid enlargement, no tenderness.  LUNGS: Normal breath sounds bilaterally, no wheezing, rales, rhonchi. No use of  accessory muscles of respiration.  CARDIOVASCULAR: S1, S2 normal. No murmurs, rubs, or gallops.  ABDOMEN: Soft, nontender, nondistended. Bowel sounds present. No organomegaly or mass.  EXTREMITIES: No cyanosis, clubbing or edema b/l.    NEUROLOGIC: Cranial nerves II through XII are intact. No focal Motor or sensory deficits b/l.   Awake responds to verbal commands PSYCHIATRIC: The patient is alert and oriented x 3.  SKIN: No obvious rash, lesion, or ulcer.   LABORATORY PANEL:   CBC Recent Labs  Lab 12/08/17 0457  WBC 12.3*  HGB 12.1  HCT 35.0  PLT 261   ------------------------------------------------------------------------------------------------------------------ Chemistries  Recent Labs  Lab 12/07/17 1142 12/08/17 0457  NA 137 138  K 3.5 3.2*  CL 104 109  CO2 26 23  GLUCOSE 98 99  BUN 33* 29*  CREATININE 1.26* 1.04*  CALCIUM 9.0 8.4*  AST 18  --   ALT <5  --   ALKPHOS 52  --   BILITOT 0.5  --    ------------------------------------------------------------------------------------------------------------------  Cardiac Enzymes Recent Labs  Lab 12/08/17 0457  TROPONINI <0.03   ------------------------------------------------------------------------------------------------------------------  RADIOLOGY:  Ct Head Wo Contrast  Result Date: 12/07/2017 CLINICAL DATA:  Altered level of consciousness. EXAM: CT HEAD WITHOUT CONTRAST TECHNIQUE: Contiguous axial images were obtained from the base of the skull through the vertex without intravenous contrast. COMPARISON:  CT scan of March 04, 2015. FINDINGS: Brain: Mild diffuse cortical atrophy. Mild chronic ischemic white matter disease. No mass effect or midline shift is noted. Ventricular size is within normal limits. There is no evidence of mass lesion, hemorrhage or acute infarction. Vascular: No hyperdense vessel or unexpected calcification. Skull: Normal.  Negative for fracture or focal lesion. Sinuses/Orbits: Right  maxillary and bilateral ethmoid sinusitis is noted. Other: None. IMPRESSION: Right maxillary and bilateral ethmoid sinusitis. Mild diffuse cortical atrophy. Mild chronic ischemic white matter disease. No acute intracranial abnormality seen. Electronically Signed   By: Marijo Conception, M.D.   On: 12/07/2017 12:08   Mr Brain Wo Contrast  Result Date: 12/08/2017 CLINICAL DATA:  Parkinson's disease and dementia. Ataxia. Acute collapse or syncopal episode. EXAM: MRI HEAD WITHOUT CONTRAST TECHNIQUE: Multiplanar, multiecho pulse sequences of the brain and surrounding structures were obtained without intravenous contrast. COMPARISON:  Head CT yesterday. FINDINGS: Brain: Diffusion imaging does not show any acute or subacute infarction. The examination shows motion degradation. There is generalized brain atrophy. There are moderate chronic small-vessel ischemic changes of the pons an of the cerebral hemispheric deep and subcortical white matter. No large vessel territory infarction. No mass lesion, hemorrhage, hydrocephalus or extra-axial collection. Vascular: Major vessels at the base of the brain show flow. Skull and upper cervical spine: Negative Sinuses/Orbits: Inflammatory changes of the right maxillary sinus. Other: None IMPRESSION: No acute intracranial finding. Generalized atrophy. Chronic small-vessel ischemic changes. Right maxillary sinus inflammation. Electronically Signed   By: Nelson Chimes M.D.   On: 12/08/2017 14:46   Dg Chest Port 1 View  Result Date: 12/08/2017 CLINICAL DATA:  84 year old female with respiratory distress intubated and admitted to the ICU. Suspected sepsis. EXAM: PORTABLE CHEST 1 VIEW COMPARISON:  12/07/2017 and earlier. FINDINGS: Portable AP semi upright view at 1051 hours. Endotracheal tube tip in good position just below the level the clavicles. Pacer/resuscitation pads over the left chest. No other lines or tubes. Mediastinal contours remain normal. Calcified aortic  atherosclerosis. Lower lung volumes with streaky bibasilar opacity. No pneumothorax or pulmonary edema. No consolidation or pleural effusion is evident. Paucity of bowel gas in the upper abdomen. No acute osseous abnormality identified. IMPRESSION: 1. Stable ET tube. 2. Increased streaky bibasilar opacity most resembling atelectasis. 3. No other acute cardiopulmonary abnormality identified. Electronically Signed   By: Genevie Ann M.D.   On: 12/08/2017 11:04   Dg Chest Port 1 View  Result Date: 12/07/2017 CLINICAL DATA:  Intubation. EXAM: PORTABLE CHEST 1 VIEW COMPARISON:  03/04/2015. FINDINGS: Endotracheal tube tip noted 3.4 cm above the carina. Heart size normal. Mild basilar atelectasis. No pleural effusion or pneumothorax. No acute bony abnormality. IMPRESSION: 1.  Endotracheal tube tip noted 3.4 cm above the carina. 2.  Mild bibasilar atelectasis. Electronically Signed   By: Marcello Moores  Register   On: 12/07/2017 12:33   Dg [place] Dys Tube Plc W/fl W/rad  Result Date: 12/08/2017 CLINICAL DATA:  NG tube placement EXAM: NASO G TUBE PLACEMENT WITH FL AND WITH RAD FLUOROSCOPY TIME:  Fluoroscopy Time:  0.1 minute Radiation Exposure Index (if provided by the fluoroscopic device): 0.9 mGy Number of Acquired Spot Images: 0 COMPARISON:  None. FINDINGS: Patient is on intubated and on a ventilator. Patient was placed on the fluoroscopy table. Multiple attempts at placing a nasogastric tube were made to the right and left nare. The nasogastric tube failed to course through the esophagus. The nasogastric tube continue to enter the left mainstem bronchus on repeated attempts. At this time the examination was terminated. IMPRESSION: Unsuccessful placement of a nasogastric tube. No nasogastric tube is left in place. Electronically Signed   By: Kathreen Devoid   On: 12/08/2017 14:01     ASSESSMENT AND PLAN:  84 year old elderly female patient with history of Parkinson's disease Alzheimer's dementia currently  in the ICU for  unresponsiveness and respiratory failure  -Status post respiratory failure Has been extubated this morning Monitor oxygen saturation Advance diet once tolerated  -Syncope Negative troponins so far  -Metabolic encephalopathy Follow-up electrolytes and cultures Continue IV Rocephin antibiotic MRI brain no acute abnormality CT head revealed ethmoid sinusitis  -Parkinson's disease Neurology evaluation Continue oral Sinemet  -Alzheimer's dementia Supportive care  -DVT prophylaxis subcu Lovenox daily   All the records are reviewed and case discussed with Care Management/Social Worker. Management plans discussed with the patient, family and they are in agreement.  CODE STATUS: Full code  DVT Prophylaxis: SCDs  TOTAL TIME TAKING CARE OF THIS PATIENT: 35 minutes.   POSSIBLE D/C IN 3 to 4 DAYS, DEPENDING ON CLINICAL CONDITION.  Saundra Shelling M.D on 12/09/2017 at 11:41 AM  Between 7am to 6pm - Pager - [phone]  After 6pm go to [email or web address] - password [email or web address] [facility]  Office  [phone]  CC: Primary care physician; Lavera Guise, MD  Note: This dictation was prepared with Dragon dictation along with smaller phrase technology. Any transcriptional errors that result from this process are unintentional.

## 2017-12-09 NOTE — Progress Notes (Signed)
Pt extubated without complications, sats 18% on roomair, respiratory rate 18/min, no stridor noted. Will continue to monitor.

## 2017-12-10 ENCOUNTER — Inpatient Hospital Stay: Payer: Medicare Other

## 2017-12-10 DIAGNOSIS — G20 Parkinson's disease: Secondary | ICD-10-CM

## 2017-12-10 DIAGNOSIS — I10 Essential (primary) hypertension: Secondary | ICD-10-CM

## 2017-12-10 DIAGNOSIS — R000 Tachycardia, unspecified: Secondary | ICD-10-CM

## 2017-12-10 DIAGNOSIS — Z7189 Other specified counseling: Secondary | ICD-10-CM

## 2017-12-10 LAB — CBC
HCT: 35 % (ref 35.0–47.0)
Hemoglobin: 12.2 g/dL (ref 12.0–16.0)
MCH: 30.3 pg (ref 26.0–34.0)
MCHC: 34.9 g/dL (ref 32.0–36.0)
MCV: 86.9 fL (ref 80.0–100.0)
PLATELETS: 210 10*3/uL (ref 150–440)
RBC: 4.03 MIL/uL (ref 3.80–5.20)
RDW: 13.5 % (ref 11.5–14.5)
WBC: 10.1 10*3/uL (ref 3.6–11.0)

## 2017-12-10 LAB — BASIC METABOLIC PANEL
Anion gap: 11 (ref 5–15)
BUN: 14 mg/dL (ref 8–23)
CALCIUM: 8.8 mg/dL — AB (ref 8.9–10.3)
CO2: 21 mmol/L — ABNORMAL LOW (ref 22–32)
Chloride: 103 mmol/L (ref 98–111)
Creatinine, Ser: 0.75 mg/dL (ref 0.44–1.00)
GFR calc Af Amer: >60 mL/min (ref >60)
GLUCOSE: 108 mg/dL — AB (ref 70–99)
Potassium: 3.3 mmol/L — ABNORMAL LOW (ref 3.5–5.1)
SODIUM: 135 mmol/L (ref 135–145)

## 2017-12-10 MED ORDER — DILTIAZEM HCL 100 MG IV SOLR
5.0000 mg/h | INTRAVENOUS | Status: DC
  Administered 2017-12-10: 5 mg/h via INTRAVENOUS
  Administered 2017-12-11: 7.5 mg/h via INTRAVENOUS
  Filled 2017-12-10 (×2): qty 100

## 2017-12-10 MED ORDER — POTASSIUM CHLORIDE 2 MEQ/ML IV SOLN
INTRAVENOUS | Status: DC
  Administered 2017-12-10 – 2017-12-13 (×4): via INTRAVENOUS
  Filled 2017-12-10 (×5): qty 1000

## 2017-12-10 MED ORDER — HYDRALAZINE HCL 20 MG/ML IJ SOLN
10.0000 mg | INTRAMUSCULAR | Status: DC | PRN
  Administered 2017-12-10: 10 mg via INTRAVENOUS
  Filled 2017-12-10: qty 1

## 2017-12-10 NOTE — Plan of Care (Signed)
Patient alert with confusion.  Remains on O2 nasal canula. Patient awake most of shift. Yelling out for help, wanting to go to the bathroom, and asking for food. Patient fidgeting with blankets, tubing, etc.  Redirection successful at times.  BP elevated. Given PRN x 1 with good effect. Good urine output.  Daughter in to visit this am.  No acute concerns at this time. Will continue to monitor.

## 2017-12-10 NOTE — Progress Notes (Signed)
Has tolerated extubation well.  Comfortable on [place] O2.  Somewhat lethargic but conversant.  Has not undergone SLP swallow evaluation yet.  Vitals:   12/10/17 1100 12/10/17 1200 12/10/17 1300 12/10/17 1351  BP: (!) 116/92 138/85 (!) 142/90   Pulse: (!) 109 (!) 104 100 99  Resp: (!) 23 (!) 25 (!) 21 (!) 25  Temp:    98.3 F (36.8 C)  TempSrc:    Oral  SpO2: 96% 96% 96% 96%  Weight:      Height:      2 LPM [place]  Gen: NAD HEENT: NCAT, sclera white Neck: No JVD Lungs: Clear anteriorly Cardiovascular: RRR, no murmurs Abdomen: Soft, nontender, normal BS Ext: without clubbing, cyanosis, edema Neuro: grossly intact Skin: Limited exam, no lesions noted  BMP Latest Ref Rng & Units 12/10/2017 12/09/2017 12/08/2017  Glucose 70 - 99 mg/dL 108(H) 103(H) 99  BUN 8 - 23 mg/dL 14 15 29(H)  Creatinine 0.44 - 1.00 mg/dL 0.75 0.80 1.04(H)  Sodium 135 - 145 mmol/L 135 135 138  Potassium 3.5 - 5.1 mmol/L 3.3(L) 3.7 3.2(L)  Chloride 98 - 111 mmol/L 103 106 109  CO2 22 - 32 mmol/L 21(L) 21(L) 23  Calcium 8.9 - 10.3 mg/dL 8.8(L) 8.5(L) 8.4(L)   CBC Latest Ref Rng & Units 12/10/2017 12/08/2017 12/07/2017  WBC 3.6 - 11.0 K/uL 10.1 12.3(H) 9.2  Hemoglobin 12.0 - 16.0 g/dL 12.2 12.1 12.2  Hematocrit 35.0 - 47.0 % 35.0 35.0 35.0  Platelets 150 - 440 K/uL 210 261 253   CXR: Bibasilar atelectasis  IMPRESSION: Ventilator dependent respiratory failure.  Intubated for unresponsiveness.  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  We briefly discussed goals of care and the concept of limiting care at the end of life.  They are very intelligent and seemed to understand all of the issues surrounding this.  I offered the palliative care service could facilitate further discussion of this issue.  At this time, they did not feel that they needed this.  Merton Border, MD PCCM service Mobile [phone] Pager [phone] 12/10/2017 2:03 PM

## 2017-12-10 NOTE — Progress Notes (Signed)
[facility] at [place] NAME: Kelly Brady    MR#:  [id number]  DATE OF BIRTH:  01-21-1931  SUBJECTIVE:  CHIEF COMPLAINT:   Chief Complaint  Patient presents with  . Respiratory Distress  Patient seen and evaluated today Off ventilator since yesterday Awake and responds to verbal commands Has periods of confusion No shortness of breath  REVIEW OF SYSTEMS:    ROS  CONSTITUTIONAL: No documented fever. No fatigue, weakness. No weight gain, no weight loss.  EYES: No blurry or double vision.  ENT: No tinnitus. No postnasal drip. No redness of the oropharynx.  RESPIRATORY: Decreased cough, no wheeze, no hemoptysis. No dyspnea.  CARDIOVASCULAR: No chest pain. No orthopnea. No palpitations. No syncope.  GASTROINTESTINAL: No nausea, no vomiting or diarrhea. No abdominal pain. No melena or hematochezia.  GENITOURINARY: No dysuria or hematuria.  ENDOCRINE: No polyuria or nocturia. No heat or cold intolerance.  HEMATOLOGY: No anemia. No bruising. No bleeding.  INTEGUMENTARY: No rashes. No lesions.  MUSCULOSKELETAL: No arthritis. No swelling. No gout.  NEUROLOGIC: No numbness, tingling, or ataxia. No seizure-type activity.  PSYCHIATRIC: No anxiety. No insomnia. No ADD.   DRUG ALLERGIES:   Allergies  Allergen Reactions  . Latex Anaphylaxis    VITALS:  Blood pressure (!) 116/92, pulse (!) 109, temperature 98.3 F (36.8 C), temperature source Axillary, resp. rate (!) 23, height 5\' 3"  (1.6 m), weight 67.2 kg, SpO2 96 %.  PHYSICAL EXAMINATION:   Physical Exam  GENERAL:  84 y.o.-year-old patient lying in the bed with no acute distress.  EYES: Pupils equal, round, reactive to light and accommodation. No scleral icterus. Extraocular muscles intact.  HEENT: Head atraumatic, normocephalic. Oropharynx and nasopharynx clear.  NECK:  Supple, no jugular venous distention. No thyroid enlargement, no tenderness.  LUNGS: Normal breath sounds  bilaterally, no wheezing, rales, rhonchi. No use of accessory muscles of respiration.  CARDIOVASCULAR: S1, S2 normal. No murmurs, rubs, or gallops.  ABDOMEN: Soft, nontender, nondistended. Bowel sounds present. No organomegaly or mass.  EXTREMITIES: No cyanosis, clubbing or edema b/l.    NEUROLOGIC: Cranial nerves II through XII are intact. No focal Motor or sensory deficits b/l.   Awake responds to verbal commands PSYCHIATRIC: The patient is alert and oriented x 3.  SKIN: No obvious rash, lesion, or ulcer.   LABORATORY PANEL:   CBC Recent Labs  Lab 12/10/17 0441  WBC 10.1  HGB 12.2  HCT 35.0  PLT 210   ------------------------------------------------------------------------------------------------------------------ Chemistries  Recent Labs  Lab 12/07/17 1142  12/09/17 2034 12/10/17 0441  NA 137   < > 135 135  K 3.5   < > 3.7 3.3*  CL 104   < > 106 103  CO2 26   < > 21* 21*  GLUCOSE 98   < > 103* 108*  BUN 33*   < > 15 14  CREATININE 1.26*   < > 0.80 0.75  CALCIUM 9.0   < > 8.5* 8.8*  MG  --   --  1.6*  --   AST 18  --   --   --   ALT <5  --   --   --   ALKPHOS 52  --   --   --   BILITOT 0.5  --   --   --    < > = values in this interval not displayed.   ------------------------------------------------------------------------------------------------------------------  Cardiac Enzymes Recent Labs  Lab 12/08/17 0457  TROPONINI <0.03   ------------------------------------------------------------------------------------------------------------------  RADIOLOGY:  Mr Brain Wo Contrast  Result Date: 12/08/2017 CLINICAL DATA:  Parkinson's disease and dementia. Ataxia. Acute collapse or syncopal episode. EXAM: MRI HEAD WITHOUT CONTRAST TECHNIQUE: Multiplanar, multiecho pulse sequences of the brain and surrounding structures were obtained without intravenous contrast. COMPARISON:  Head CT yesterday. FINDINGS: Brain: Diffusion imaging does not show any acute or subacute  infarction. The examination shows motion degradation. There is generalized brain atrophy. There are moderate chronic small-vessel ischemic changes of the pons an of the cerebral hemispheric deep and subcortical white matter. No large vessel territory infarction. No mass lesion, hemorrhage, hydrocephalus or extra-axial collection. Vascular: Major vessels at the base of the brain show flow. Skull and upper cervical spine: Negative Sinuses/Orbits: Inflammatory changes of the right maxillary sinus. Other: None IMPRESSION: No acute intracranial finding. Generalized atrophy. Chronic small-vessel ischemic changes. Right maxillary sinus inflammation. Electronically Signed   By: Nelson Chimes M.D.   On: 12/08/2017 14:46   Dg Chest Port 1 View  Result Date: 12/10/2017 CLINICAL DATA:  Acute respiratory failure EXAM: PORTABLE CHEST 1 VIEW COMPARISON:  12/08/2017 chest radiograph. FINDINGS: Stable cardiomediastinal silhouette with mild cardiomegaly. No pneumothorax. Trace bilateral pleural effusions, stable. No overt pulmonary edema. Increased patchy lower parahilar opacities in both lungs. IMPRESSION: Increased patchy lower parahilar opacities in both lungs, which could represent atelectasis, aspiration and/or pneumonia. Stable cardiomegaly without overt pulmonary edema. Trace bilateral pleural effusions, stable. Electronically Signed   By: Ilona Sorrel M.D.   On: 12/10/2017 08:00   Dg Naso G Tube Plc W/fl W/rad  Result Date: 12/08/2017 CLINICAL DATA:  NG tube placement EXAM: NASO G TUBE PLACEMENT WITH FL AND WITH RAD FLUOROSCOPY TIME:  Fluoroscopy Time:  0.1 minute Radiation Exposure Index (if provided by the fluoroscopic device): 0.9 mGy Number of Acquired Spot Images: 0 COMPARISON:  None. FINDINGS: Patient is on intubated and on a ventilator. Patient was placed on the fluoroscopy table. Multiple attempts at placing a nasogastric tube were made to the right and left nare. The nasogastric tube failed to course through  the esophagus. The nasogastric tube continue to enter the left mainstem bronchus on repeated attempts. At this time the examination was terminated. IMPRESSION: Unsuccessful placement of a nasogastric tube. No nasogastric tube is left in place. Electronically Signed   By: Kathreen Devoid   On: 12/08/2017 14:01     ASSESSMENT AND PLAN:  84 year old elderly female patient with history of Parkinson's disease Alzheimer's dementia currently in the ICU for unresponsiveness and respiratory failure  -Status post respiratory failure Off ventilator Monitor oxygen saturation Advance diet to swallow study Transferred to medical floor today  -Syncope Negative troponins so far  -Metabolic encephalopathy improved Follow-up electrolytes and cultures Continue IV Rocephin antibiotic MRI brain no acute abnormality CT head revealed ethmoid sinusitis  -Parkinson's disease with dementia Neurology evaluation Continue oral Sinemet  -Alzheimer's dementia Supportive care  -DVT prophylaxis subcu Lovenox daily   All the records are reviewed and case discussed with Care Management/Social Worker. Management plans discussed with the patient, family and they are in agreement.  CODE STATUS: Full code  DVT Prophylaxis: SCDs  TOTAL TIME TAKING CARE OF THIS PATIENT: 33 minutes.   POSSIBLE D/C IN 3 to 4 DAYS, DEPENDING ON CLINICAL CONDITION.  Saundra Shelling M.D on 12/10/2017 at 11:50 AM  Between 7am to 6pm - Pager - [phone]  After 6pm go to [email or web address] - password [email or web address] [facility]  Office  [phone]  CC: Primary care physician; Lavera Guise, MD  Note: This dictation was prepared with Dragon dictation along with smaller phrase technology. Any transcriptional errors that result from this process are unintentional.

## 2017-12-11 ENCOUNTER — Other Ambulatory Visit: Payer: Self-pay

## 2017-12-11 ENCOUNTER — Inpatient Hospital Stay
Admit: 2017-12-11 | Discharge: 2017-12-11 | Disposition: A | Discharge status: Home / Self Care | Payer: Medicare Other | Attending: Internal Medicine | Admitting: Internal Medicine

## 2017-12-11 LAB — ECHOCARDIOGRAM COMPLETE
HEIGHTINCHES: 63 in
WEIGHTICAEL: 2391.55 [oz_av]

## 2017-12-11 LAB — CULTURE, BLOOD (ROUTINE X 2)

## 2017-12-11 LAB — PHOSPHORUS: Phosphorus: 2.2 mg/dL — ABNORMAL LOW (ref 2.5–4.6)

## 2017-12-11 LAB — BASIC METABOLIC PANEL
ANION GAP: 9 (ref 5–15)
BUN: 16 mg/dL (ref 8–23)
CO2: 21 mmol/L — AB (ref 22–32)
Calcium: 8.4 mg/dL — ABNORMAL LOW (ref 8.9–10.3)
Chloride: 103 mmol/L (ref 98–111)
Creatinine, Ser: 0.64 mg/dL (ref 0.44–1.00)
Glucose, Bld: 108 mg/dL — ABNORMAL HIGH (ref 70–99)
POTASSIUM: 3 mmol/L — AB (ref 3.5–5.1)
Sodium: 133 mmol/L — ABNORMAL LOW (ref 135–145)

## 2017-12-11 LAB — POTASSIUM: Potassium: 3.6 mmol/L (ref 3.5–5.1)

## 2017-12-11 LAB — MAGNESIUM: Magnesium: 1.9 mg/dL (ref 1.7–2.4)

## 2017-12-11 MED ORDER — DILTIAZEM HCL 30 MG PO TABS
30.0000 mg | ORAL_TABLET | Freq: Three times a day (TID) | ORAL | Status: DC
  Administered 2017-12-11 – 2017-12-13 (×3): 30 mg via ORAL
  Filled 2017-12-11 (×5): qty 1

## 2017-12-11 MED ORDER — POTASSIUM CHLORIDE 10 MEQ/100ML IV SOLN
10.0000 meq | INTRAVENOUS | Status: AC
  Administered 2017-12-11 (×6): 10 meq via INTRAVENOUS
  Filled 2017-12-11 (×6): qty 100

## 2017-12-11 NOTE — Progress Notes (Signed)
   Name: Kelly Brady MRN: [id number] DOB: 1930-08-12     CONSULTATION DATE: 12/07/2017  Subjective & Objective: On Cardizem gtt. No distress and no major issues last night  PAST MEDICAL HISTORY :   has a past medical history of Closed fracture of unspecified part of vertebral column without mention of spinal cord injury, Dementia without behavioral disturbance (01/11/2013), Disturbance of skin sensation, Lumbago, Paralysis agitans ([place]), Rheumatoid arthritis(714.0), and Unspecified vitamin D deficiency.  has a past surgical history that includes Lumbar spine surgery (2008); Cataract extraction (Right, 2013); Vertebroplasty (2011); and Melanoma excision (Left, 2013). Prior to Admission medications   Medication Sig Start Date End Date Taking? Authorizing Provider  acetaminophen (TYLENOL) 500 MG tablet Take 500 mg by mouth daily.    Yes [provider]  carbidopa-levodopa (SINEMET CR) 50-200 MG tablet Take 1/2 tab in morning, 1/2 tab at lunch, and 1 tab at bedtime. 08/28/17  Yes Ronnell Freshwater, NP  escitalopram (LEXAPRO) 10 MG tablet TAKE 1 TABLET BY MOUTH AT BEDTIME 08/28/17  Yes Lavera Guise, MD  lansoprazole (PREVACID) 30 MG capsule Take 30 mg by mouth daily.    Yes [provider]  losartan-hydrochlorothiazide (HYZAAR) 50-12.5 MG tablet Take 1 tablet by mouth daily. 10/11/17  Yes [provider]  Melatonin 5 MG TABS Take 5 mg by mouth at bedtime as needed (sleep).   Yes [provider]  pramipexole (MIRAPEX) 0.25 MG tablet Take 1 tablet (0.25MG ) by mouth at lunchtime and 1 tablet (0.25MG ) by mouth at bedtime   Yes [provider]   Allergies  Allergen Reactions  . Latex Anaphylaxis    FAMILY HISTORY:  family history includes Kidney failure in her mother; Stroke in her father. SOCIAL HISTORY:  reports that she quit smoking about 55 years ago. Her smoking use included cigarettes. She does not have any smokeless tobacco history on file.  She reports that she does not drink alcohol or use drugs.  REVIEW OF SYSTEMS:   Unable to obtain due to critical illness   VITAL SIGNS: Temp:  [97.7 F (36.5 C)-98.8 F (37.1 C)] 97.7 F (36.5 C) (08/26 0200) Pulse Rate:  [79-114] 86 (08/26 0630) Resp:  [13-35] 18 (08/26 0630) BP: (96-159)/(53-124) 143/70 (08/26 0630) SpO2:  [94 %-98 %] 97 % (08/26 0630) Weight:  [67.8 kg] 67.8 kg (08/26 0500)  Physical Examination:  Awake, disoriented to time and no focal motor deficits. Base dementia On [place], no distress, able to talk in full sentences, BEAE and no rales S1 & Sw audible and no murmur Benign abdomen with normal peristalses No leg edema  ASSESSMENT / PLAN:  Acute respiratory failure s/p extubation and tolerating [place] -Monitor work of breathing and O2 sat  Dementia with advanced Parkinson's disease and chronic ataxia. No acute abnormality on MRI. -Supportive care -On Sinemet   A fib with RVR on Cardiazem gtt -Rate control  UTI. E Coli -Rocephin  Atelectasis and pneumonia. Worsening bibasilar airspace disease -Continue with Rocephin, respiratory care and monitor CXR + CBC + FIO2. -Consider HCAP coverage if develops SIRS.  Sinusitis (Rt. Maxillary and Bil. Ethmoid) -Rocephin  Contaminant blood culture. MSSA caog -ve in blood -No need to treat.  Hypokalemia -Replete and monitor electrolytes  Dysphagia -Awaiting swallowing evaluation  Questionable CAD with  Inferior ischemic changes on EKG -ECHO  Full code  DVT & GI prophylaxis. Continue with supportive care.  Critical care time 30 min

## 2017-12-11 NOTE — Clinical Social Work Note (Signed)
Clinical Social Work Assessment  Patient Details  Name: Kelly Brady MRN: [id number] Date of Birth: 1930/09/13  Date of referral:  12/11/17               Reason for consult:  Facility Placement                Permission sought to share information with:  Chartered certified accountant granted to share information::  Yes, Verbal Permission Granted  Name::      Brady::   Kelly   Relationship::     Contact Information:     Housing/Transportation Living arrangements for the past 2 months:  Single Family Home Source of Information:  Adult Children, Spouse Patient Interpreter Needed:  None Criminal Activity/Legal Involvement Pertinent to Current Situation/Hospitalization:  No - Comment as needed Significant Relationships:  Adult Children, Spouse Lives with:  Spouse Do you feel safe going back to the place where you live?  Yes Need for family participation in patient care:  Yes (Comment)  Care giving concerns:  Patient lives in [place] with her husband Kelly Brady.    Social Worker assessment / plan:  Holiday representative (CSW) reviewed chart and noted that PT is recommending SNF. CSW met with patient and her husband Kelly Brady and daughter Kelly Brady were at bedside. Patient was asleep and did not participate in assessment. CSW introduced self and explained role of CSW department. Per daughter patient lives in [place] with her husband and daughter lives in [place]. Per husband patient was walking at baseline with assistance. CSW explained SNF process and that [facility] will have to approve it. Daughter and husband are agreeable to SNF search in [place]. FL2 complete and faxed out.   CSW presented bed offers to daughter and husband, they chose [facility]. Per Tammy admissions coordinator at [facility] she will start [facility] authorization. CSW will continue to follow and assist as needed.    Employment status:  Disabled (Comment on whether or not  currently receiving Disability), Retired Nurse, adult PT Recommendations:  [place] / Referral to community resources:  [place]  Patient/Family's Response to care:  Patient's daughter and husband chose [facility].   Patient/Family's Understanding of and Emotional Response to Diagnosis, Current Treatment, and Prognosis:  Patient's husband and daughter were very pleasant and thanked CSW for assistance.   Emotional Assessment Appearance:  Appears stated age Attitude/Demeanor/Rapport:  Unable to Assess Affect (typically observed):  Unable to Assess Orientation:  Oriented to Self, Oriented to Place, Fluctuating Orientation (Suspected and/or reported Sundowners) Alcohol / Substance use:  Not Applicable Psych involvement (Current and /or in the community):  No (Comment)  Discharge Needs  Concerns to be addressed:  Discharge Planning Concerns Readmission within the last 30 days:  No Current discharge risk:  Dependent with Mobility Barriers to Discharge:  Continued Medical Work up   [employer], Veronia Beets, LCSW 12/11/2017, 4:18 PM

## 2017-12-11 NOTE — Progress Notes (Signed)
[place] at [place] NAME: Kelly Brady    MR#:  [id number]  DATE OF BIRTH:  [birth year]  SUBJECTIVE:  CHIEF COMPLAINT:   Chief Complaint  Patient presents with  . Respiratory Distress  Patient seen and evaluated today On oxygen via nasal cannula Awake and responds to verbal commands Passed swallow study and on dysphagia diet  blood pressure is borderline  REVIEW OF SYSTEMS:    ROS  CONSTITUTIONAL: No documented fever. No fatigue, weakness. No weight gain, no weight loss.  EYES: No blurry or double vision.  ENT: No tinnitus. No postnasal drip. No redness of the oropharynx.  RESPIRATORY: Decreased cough, no wheeze, no hemoptysis. No dyspnea.  CARDIOVASCULAR: No chest pain. No orthopnea. No palpitations. No syncope.  GASTROINTESTINAL: No nausea, no vomiting or diarrhea. No abdominal pain. No melena or hematochezia.  GENITOURINARY: No dysuria or hematuria.  ENDOCRINE: No polyuria or nocturia. No heat or cold intolerance.  HEMATOLOGY: No anemia. No bruising. No bleeding.  INTEGUMENTARY: No rashes. No lesions.  MUSCULOSKELETAL: No arthritis. No swelling. No gout.  NEUROLOGIC: No numbness, tingling, or ataxia. No seizure-type activity.  PSYCHIATRIC: No anxiety. No insomnia. No ADD.   DRUG ALLERGIES:   Allergies  Allergen Reactions  . Latex Anaphylaxis    VITALS:  Blood pressure 126/77, pulse 87, temperature (!) 97.3 F (36.3 C), temperature source Axillary, resp. rate 19, height 5\' 3"  (1.6 m), weight 67.8 kg, SpO2 96 %.  PHYSICAL EXAMINATION:   Physical Exam  GENERAL:  84 y.o.-year-old patient lying in the bed with no acute distress.  EYES: Pupils equal, round, reactive to light and accommodation. No scleral icterus. Extraocular muscles intact.  HEENT: Head atraumatic, normocephalic. Oropharynx and nasopharynx clear.  NECK:  Supple, no jugular venous distention. No thyroid enlargement, no tenderness.  LUNGS: Normal breath  sounds bilaterally, no wheezing, rales, rhonchi. No use of accessory muscles of respiration.  CARDIOVASCULAR: S1, S2 normal. No murmurs, rubs, or gallops.  ABDOMEN: Soft, nontender, nondistended. Bowel sounds present. No organomegaly or mass.  EXTREMITIES: No cyanosis, clubbing or edema b/l.    NEUROLOGIC: Cranial nerves II through XII are intact. No focal Motor or sensory deficits b/l.   Awake responds to verbal commands PSYCHIATRIC: The patient is alert and oriented x 3.  SKIN: No obvious rash, lesion, or ulcer.   LABORATORY PANEL:   CBC Recent Labs  Lab 12/10/17 0441  WBC 10.1  HGB 12.2  HCT 35.0  PLT 210   ------------------------------------------------------------------------------------------------------------------ Chemistries  Recent Labs  Lab 12/07/17 1142  12/11/17 0452  NA 137   < > 133*  K 3.5   < > 3.0*  CL 104   < > 103  CO2 26   < > 21*  GLUCOSE 98   < > 108*  BUN 33*   < > 16  CREATININE 1.26*   < > 0.64  CALCIUM 9.0   < > 8.4*  MG  --    < > 1.9  AST 18  --   --   ALT <5  --   --   ALKPHOS 52  --   --   BILITOT 0.5  --   --    < > = values in this interval not displayed.   ------------------------------------------------------------------------------------------------------------------  Cardiac Enzymes Recent Labs  Lab 12/08/17 0457  TROPONINI <0.03   ------------------------------------------------------------------------------------------------------------------  RADIOLOGY:  Dg Chest Port 1 View  Result Date: 12/10/2017 CLINICAL DATA:  Acute respiratory failure EXAM: PORTABLE  CHEST 1 VIEW COMPARISON:  12/08/2017 chest radiograph. FINDINGS: Stable cardiomediastinal silhouette with mild cardiomegaly. No pneumothorax. Trace bilateral pleural effusions, stable. No overt pulmonary edema. Increased patchy lower parahilar opacities in both lungs. IMPRESSION: Increased patchy lower parahilar opacities in both lungs, which could represent atelectasis,  aspiration and/or pneumonia. Stable cardiomegaly without overt pulmonary edema. Trace bilateral pleural effusions, stable. Electronically Signed   By: Ilona Sorrel M.D.   On: 12/10/2017 08:00     ASSESSMENT AND PLAN:  84 year old elderly female patient with history of Parkinson's disease Alzheimer's dementia currently in the ICU for unresponsiveness and respiratory failure  -Status post respiratory failure Off ventilator since two days Monitor oxygen saturation Dysphagia diet Transferred to medical floor today once bed is available  -Syncope Negative troponins so far  -Metabolic encephalopathy improved Follow-up electrolytes and cultures Continue IV Rocephin antibiotic for E. coli urinary tract infection MRI brain no acute abnormality CT head revealed ethmoid sinusitis  -E. coli urinary tract infection Continue IV Rocephin antibiotic  -Parkinson's disease with dementia Neurology evaluation Continue oral Sinemet  -Alzheimer's dementia Supportive care  -DVT prophylaxis subcu Lovenox daily  -Blood culture revealed Staphylococcus coagulase-negative Contaminant   All the records are reviewed and case discussed with Care Management/Social Worker. Management plans discussed with the patient, family and they are in agreement.  CODE STATUS: Full code  DVT Prophylaxis: SCDs  TOTAL TIME TAKING CARE OF THIS PATIENT: 32 minutes.   POSSIBLE D/C IN 3 to 4 DAYS, DEPENDING ON CLINICAL CONDITION.  Saundra Shelling M.D on 12/11/2017 at 1:52 PM  Between 7am to 6pm - Pager - [phone]  After 6pm go to [email or web address] - password [email or web address] [facility]  Office  [phone]  CC: Primary care physician; Lavera Guise, MD  Note: This dictation was prepared with Dragon dictation along with smaller phrase technology. Any transcriptional errors that result from this process are unintentional.

## 2017-12-11 NOTE — Progress Notes (Signed)
Informed NP that K level is 3.3 this AM, and patient will need IV replacement since she is still NPO and awaiting ST evaluation.

## 2017-12-11 NOTE — Progress Notes (Signed)
OT Cancellation Note  Patient Details Name: Kelly Brady MRN: [id number] DOB: 11-29-1930   Cancelled Treatment:    Reason Eval/Treat Not Completed: Patient's level of consciousness.  Order received and chart reviewed.  Pt recently transferred from ICU to floor and pt not arousing to voice or tactile stimulation.  Pt's daughter [name] and her husband [name] present in room with her.  Family given adaptive equipment catalog to review. Will attempt OT evaluation again tomorrow.  Chrys Racer, OTR/L ascom [phone] 12/11/17, 3:48 PM

## 2017-12-11 NOTE — Progress Notes (Signed)
Nutrition Follow-up  DOCUMENTATION CODES:   Not applicable  INTERVENTION:  Provide Magic cup TID with meals, each supplement provides 290 kcal and 9 grams of protein.  NUTRITION DIAGNOSIS:   Inadequate oral intake related to acute illness(pt sedated and ventilated ) as evidenced by NPO status.  Resolving as diet now advancing.  GOAL:   Patient will meet greater than or equal to 90% of their needs  Progressing.  MONITOR:   PO intake, Supplement acceptance, Diet advancement, Labs, Weight trends, Skin, I & O's  REASON FOR ASSESSMENT:   Ventilator    ASSESSMENT:   84 y.o. female with a past medical history remarkable for dementia, rheumatoid arthritis, Parkinson's disease with chronic ataxia   -Patient was extubated on 8/24. -Diet advanced to dysphagia 1 with honey-thick liquids following SLP evaluation today.  Met with patient and her daughter at bedside. Patient was sleeping. Daughter reports patient typically has a fairly good appetite. Her baseline diet is a regular diet with thin liquids. She reports patient has never been on a dysphagia diet before so she is hopeful her diet will be able to be advanced as her swallowing improves. Patient had some grits and Magic Cup this AM. Noted patient still has not had a bowel movement this admission.  Medications reviewed and include: ceftriaxone, DtLR at 50 mL/hr, diltiazem gtt, potassium chloride 10 mEq IV 6 times today.  Labs reviewed: Sodium 133, Potassium 3, CO2 21, Phosphorus 2.2.  I/O: 1300 mL UOP yesterday (0.8 mL/kg/hr)  Weight trend: 67.8 kg on 8/26; +5.3 kg from admission  Discussed with RN and on rounds.  Diet Order:   Diet Order            DIET - DYS 1 Room service appropriate? Yes; Fluid consistency: Honey Thick  Diet effective now              EDUCATION NEEDS:   Not appropriate for education at this time  Skin:  Skin Assessment: Skin Integrity Issues:(MSAD to buttocks and perineum)  Last BM:   Unknown/PTA  Height:   Ht Readings from Last 1 Encounters:  12/07/17 _0  (1.6 m)    Weight:   Wt Readings from Last 1 Encounters:  12/11/17 67.8 kg    Ideal Body Weight:  52.3 kg  BMI:  Body mass index is 26.48 kg/m.  Estimated Nutritional Needs:   Kcal:  1255-1465 (MSJ x 1.2-1.4)  Protein:  78-89g/day   Fluid:  >1.3L/day   Willey Blade, MS, RD, LDN Office: [phone] Pager: [phone] After Hours/Weekend Pager: [phone]

## 2017-12-11 NOTE — Evaluation (Signed)
Clinical/Bedside Swallow Evaluation Patient Details  Name: Kelly Brady MRN: [id number] Date of Birth: 04-07-1931  Today's Date: 12/11/2017 Time: SLP Start Time (ACUTE ONLY): 0840 SLP Stop Time (ACUTE ONLY): 0920 SLP Time Calculation (min) (ACUTE ONLY): 40 min  Past Medical History:  Past Medical History:  Diagnosis Date  . Closed fracture of unspecified part of vertebral column without mention of spinal cord injury   . Dementia without behavioral disturbance 01/11/2013  . Disturbance of skin sensation   . Lumbago   . Paralysis agitans ([place])   . Rheumatoid arthritis(714.0)   . Unspecified vitamin D deficiency    Past Surgical History:  Past Surgical History:  Procedure Laterality Date  . CATARACT EXTRACTION Right 2013  . LUMBAR SPINE SURGERY  2008  . MELANOMA EXCISION Left 2013   arm  . VERTEBROPLASTY  2011   HPI:  Per admitting H&P: Kelly Brady  is a 84 y.o. female with a known history of Parkinson's disease with dementia/chronic ataxia, was being assisted into her home by family whereupon she collapsed to the floor, became acutely unresponsive, brought to the emergency room for further evaluation/care, patient required emergent intubation for airway protection for acute severe obtundation, ER work-up noted for acute dehydration, urinalysis suspicious for UTI, creatinine 1.2, EKG noted for ST depression laterally with T wave inversions/right bundle branch block, patient evaluated in the ER, husband and multiple family members present, patient is currently comatose on ventilator, patient is now been admitted for acute coma due to acute hypoxic respiratory failure, probable acute urinary tract infection, dehydration.   Assessment / Plan / Recommendation Clinical Impression  Patient presents with s/s consistent with mod to severe pharyngeal phase dysphagia. No overt s/s aspiration observed with any consistency tested; however patient at mod to severe risk of aspiration due to  mod to severe delayed initiation of swallow, decreased cognitive status including poor awareness of swallow initiation, and marked decreased laryngeal elevation. Laryngeal elevation and swallow initiation mildly improved with puree and tsp honey thick liquid vs nectar thick liquids. Vocal quality remained clear throughout evaluation. Oral phase c/b mild oral transit delay and oral prep deficits with puree, nectar and honey thick liquids by tsp. No oral residue noted. Unable to complete oral mech exam, patient does not follow commands. Daughter reports patient has not had any history of problems with her swallowing. Patient kept eyes closed for most of evaluation despite cues to keep eyes open. Patient otherwise appeared alert conversing actively with SLP and daughter throughout evaluation. Recommend Dysphagia I (Puree) diet with honey thick liquids by TSP only.  Recommend total assist with feeding, feed ONLY when ALERT, provide cues after EVERY swallow to swallow again, ALTERNATE tsp's honey thick liquid with tsp puree, strict aspiration precautions, cease feeding with any overt s/s aspiration and notify MD and SLP. Recommend give meds crushed in puree. Keep patient upright for at least 30-60 minutes after PO intake. SLP to f/u with toleration of diet and provide trials of upgraded consistency when appropriate. SLP Visit Diagnosis: Dysphagia, oropharyngeal phase (R13.12)    Aspiration Risk  Moderate aspiration risk;Severe aspiration risk    Diet Recommendation Dysphagia 1 (Puree);Honey-thick liquid   Liquid Administration via: Spoon Medication Administration: Crushed with puree Supervision: Full supervision/cueing for compensatory strategies Compensations: Minimize environmental distractions;Slow rate;Small sips/bites;Multiple dry swallows after each bite/sip;Follow solids with liquid;Effortful swallow Postural Changes: Seated upright at 90 degrees;Remain upright for at least 30 minutes after po intake     Other  Recommendations Oral Care Recommendations:  Oral care BID   Follow up Recommendations Skilled Nursing facility      Frequency and Duration min 4x/week  2 weeks       Prognosis Prognosis for Safe Diet Advancement: Fair Barriers to Reach Goals: Cognitive deficits      Swallow Study   General Date of Onset: 12/11/17 HPI: Per admitting H&P: Kelly Brady  is a 84 y.o. female with a known history of Parkinson's disease with dementia/chronic ataxia, was being assisted into her home by family whereupon she collapsed to the floor, became acutely unresponsive, brought to the emergency room for further evaluation/care, patient required emergent intubation for airway protection for acute severe obtundation, ER work-up noted for acute dehydration, urinalysis suspicious for UTI, creatinine 1.2, EKG noted for ST depression laterally with T wave inversions/right bundle branch block, patient evaluated in the ER, husband and multiple family members present, patient is currently comatose on ventilator, patient is now been admitted for acute coma due to acute hypoxic respiratory failure, probable acute urinary tract infection, dehydration. Type of Study: Bedside Swallow Evaluation Diet Prior to this Study: NPO Temperature Spikes Noted: Yes Respiratory Status: Nasal cannula History of Recent Intubation: Yes Length of Intubations (days): 4 days Date extubated: 12/10/17 Behavior/Cognition: Pleasant mood;Confused;Distractible;Requires cueing;Doesn't follow directions Oral Cavity Assessment: Dry Oral Cavity - Dentition: Adequate natural dentition Self-Feeding Abilities: Total assist Patient Positioning: Upright in bed Baseline Vocal Quality: Normal Volitional Cough: Weak Volitional Swallow: Able to elicit(with max cues)    Oral/Motor/Sensory Function Overall Oral Motor/Sensory Function: Generalized oral weakness   Ice Chips Ice chips: Not tested   Thin Liquid Thin Liquid: Not tested     Nectar Thick Nectar Thick Liquid: Impaired Presentation: Spoon Pharyngeal Phase Impairments: Suspected delayed Swallow;Decreased hyoid-laryngeal movement   Honey Thick Honey Thick Liquid: Impaired Pharyngeal Phase Impairments: Suspected delayed Swallow;Decreased hyoid-laryngeal movement   Puree Puree: Impaired Pharyngeal Phase Impairments: Suspected delayed Swallow;Decreased hyoid-laryngeal movement   Solid     Solid: Not tested      Reign Bartnick, MA, CCC-SLP 12/11/2017,9:28 AM

## 2017-12-11 NOTE — Progress Notes (Signed)
Patient received from CCU,Vital signs within normal limits,room air,iv fluids infusing.

## 2017-12-11 NOTE — Progress Notes (Signed)
*  PRELIMINARY RESULTS* Echocardiogram 2D Echocardiogram has been performed.  Kelly Brady Kelly Brady Adorian Gwynne 12/11/2017, 1:41 PM

## 2017-12-11 NOTE — Plan of Care (Signed)
Pt transfer to 2A, report called to [place], pt is now on Room air, and PO cardizem, VSS, pt and daughter agreeable to transfer.  Chart belongings and meds to transfer with her

## 2017-12-11 NOTE — Evaluation (Signed)
Physical Therapy Evaluation Patient Details Name: Kelly Brady MRN: [id number] DOB: 08/20/1930 Today's Date: 12/11/2017   History of Present Illness  Pt is an 84 y.o. female presenting to hospital 12/07/17 after being found unresponsive by family and intubated d/t acute coma d/t hypoxic respiratory failure, probably acute UTI, and dehydration; extubated 12/09/17.  PMH includes dementia, Parkinson's, h/o spinal fx, h/o L-spine surgery 2008, h/o vertebroplasty 2011, RA.  Clinical Impression  Prior to hospital admission, pt was ambulatory household distances with hand hold assist; has assist from husband, home health aide, and family.  Pt lives with her husband in 1 level home with 1 plus 1 steps to enter (no railings).  Currently pt is 2 assist with bed mobility and standing; pt unable to initiate any steps with 2 assist and assist with weightshifting.  Pt's LE's very rigid in bed but able to bend pt's B hips and knees once sitting on edge of bed.  Able to initiate movement better in UE's compared to LE's.  Overall generalized weakness noted; L lean noted in sitting and standing as well.  Pt would benefit from skilled PT to address noted impairments and functional limitations (see below for any additional details).  Upon hospital discharge, recommend pt discharge to [facility].    Follow Up Recommendations SNF    Equipment Recommendations  Wheelchair (measurements PT);Wheelchair cushion (measurements PT);Rolling walker with 5" wheels    Recommendations for Other Services OT consult     Precautions / Restrictions Precautions Precautions: Fall Precaution Comments: Aspiration Restrictions Weight Bearing Restrictions: No      Mobility  Bed Mobility Overal bed mobility: Needs Assistance Bed Mobility: Supine to Sit;Sit to Supine     Supine to sit: Max assist;+2 for physical assistance;HOB elevated Sit to supine: Max assist;+2 for physical assistance;HOB elevated   General bed mobility  comments: assist for trunk and B LE's semi-supine to/from sit; 2 assist to boost pt up in bed end of session  Transfers Overall transfer level: Needs assistance Equipment used: None Transfers: Sit to/from Stand Sit to Stand: Mod assist;+2 physical assistance         General transfer comment: assist to initiate to stand  Ambulation/Gait Ambulation/Gait assistance: Total assist           General Gait Details: pt unable to take any steps with vc's and assist with weight shifting (2 assist)  Stairs            Wheelchair Mobility    Modified Rankin (Stroke Patients Only)       Balance Overall balance assessment: Needs assistance Sitting-balance support: Bilateral upper extremity supported;Feet unsupported Sitting balance-Leahy Scale: Poor Sitting balance - Comments: pt with left lean requiring CGA to mod assist for sitting balance in neutral position   Standing balance support: No upper extremity supported Standing balance-Leahy Scale: Poor Standing balance comment: mod assist x2 for static standing balance                             Pertinent Vitals/Pain Pain Assessment: Faces Faces Pain Scale: No hurt Pain Intervention(s): Limited activity within patient's tolerance;Monitored during session;Repositioned  Vitals stable and WFL throughout treatment session.    Home Living Family/patient expects to be discharged to:: Private residence Living Arrangements: Spouse/significant other(88 y.o. husband assists pt with ambulating but had knee replacement surgery about 1 month ago) Available Help at Discharge: Family;Personal care attendant Type of Home: House Home Access: Stairs to enter Entrance Stairs-Rails:  None Entrance Stairs-Number of Steps: 1 plus 1 (no railing) Home Layout: One level Home Equipment: Shower seat;Toilet riser      Prior Function Level of Independence: Needs assistance   Gait / Transfers Assistance Needed: Pt utilizes hand hold  assist for ambulating within home (from family or caregiver).  ADL's / Homemaking Assistance Needed: Assist for ADL's and meals.  Comments: Pt has home health aide 4 hours/day.  No h/o falls in past 6 months.     Hand Dominance        Extremity/Trunk Assessment   Upper Extremity Assessment Upper Extremity Assessment: Difficult to assess due to impaired cognition(fair B hand grip strength; elbow flexion B AROM at least to 90 degrees (may be limited d/t pain from IV placement); B shoulder flexion AROM to grossly 40 degrees (PROM to grossly 80 degrees))    Lower Extremity Assessment Lower Extremity Assessment: Difficult to assess due to impaired cognition(able to wiggle B toes; PROM to grossly neutral DF, 90 degrees knee and hip flexion )    Cervical / Trunk Assessment Cervical / Trunk Assessment: Kyphotic  Communication   Communication: (Difficult to understand pt at times)  Cognition Arousal/Alertness: (Intermittently closing eyes but opens with vc's) Behavior During Therapy: Flat affect                                   General Comments: Oriented to name and DOB with extra time      General Comments General comments (skin integrity, edema, etc.): Pt resting in bed upon PT arrival; pt's daughter present during session Nursing cleared pt for participation in physical therapy and present during session assisting with pt's mobility.  Pt and pt's daughter agreeable to PT session.   Exercises  Transfer and gait initiation   Assessment/Plan    PT Assessment Patient needs continued PT services  PT Problem List Decreased strength;Decreased range of motion;Decreased activity tolerance;Decreased balance;Decreased mobility;Decreased knowledge of use of DME;Decreased knowledge of precautions       PT Treatment Interventions DME instruction;Gait training;Stair training;Functional mobility training;Therapeutic activities;Therapeutic exercise;Balance  training;Patient/family education    PT Goals (Current goals can be found in the Care Plan section)  Acute Rehab PT Goals Patient Stated Goal: to improve mobility PT Goal Formulation: With patient/family Time For Goal Achievement: 12/25/17 Potential to Achieve Goals: Good    Frequency Min 2X/week   Barriers to discharge Decreased caregiver support      Co-evaluation               AM-PAC PT "6 Clicks" Daily Activity  Outcome Measure Difficulty turning over in bed (including adjusting bedclothes, sheets and blankets)?: Unable Difficulty moving from lying on back to sitting on the side of the bed? : Unable Difficulty sitting down on and standing up from a chair with arms (e.g., wheelchair, bedside commode, etc,.)?: Unable Help needed moving to and from a bed to chair (including a wheelchair)?: Total Help needed walking in hospital room?: Total Help needed climbing 3-5 steps with a railing? : Total 6 Click Score: 6    End of Session Equipment Utilized During Treatment: Gait belt Activity Tolerance: Patient tolerated treatment well Patient left: in bed;with call bell/phone within reach;with bed alarm set;Other (comment)(B LE's elevated via pillow with heels offloaded) Nurse Communication: Mobility status;Precautions PT Visit Diagnosis: Other abnormalities of gait and mobility (R26.89);Muscle weakness (generalized) (M62.81);Difficulty in walking, not elsewhere classified (R26.2)    Time: 7628-3151  PT Time Calculation (min) (ACUTE ONLY): 28 min   Charges:   PT Evaluation $PT Eval Low Complexity: 1 Low PT Treatments $Therapeutic Activity: 8-22 mins      Leitha Bleak, PT 12/11/17, 1:23 PM [phone]

## 2017-12-11 NOTE — NC FL2 (Signed)
[place] LEVEL OF CARE SCREENING TOOL     IDENTIFICATION  Patient Name: Kelly Brady Birthdate: 1930/11/25 Sex: female Admission Date (Current Location): 12/07/2017  [place] and [place] Number:  Engineering geologist and Address:  [facility], [address] [place], [place] [address]      Provider Number: [id number]  Attending Physician Name and Address:  Saundra Shelling, MD  Relative Name and Phone Number:       Current Level of Care: Hospital Recommended Level of Care: [place] Prior Approval Number:    Date Approved/Denied:   PASRR Number: [id number])  Discharge Plan: SNF    Current Diagnoses: Patient Active Problem List   Diagnosis Date Noted  . Respiratory failure ([place]) 12/07/2017  . 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 Arthritis, degenerative 10/01/2013  . Atrophic vaginitis 10/01/2013  . BP (high blood pressure) 10/01/2013  . Cannot sleep 10/01/2013  . Clinical depression 10/01/2013  . DD (diverticular disease) 10/01/2013  . Headache, migraine 10/01/2013  . History of colon polyps 10/01/2013  . HLD (hyperlipidemia) 10/01/2013  . OP (osteoporosis) 10/01/2013  . Parkinson's disease ([place]) 10/01/2013  . Dementia without behavioral disturbance 01/11/2013    Orientation RESPIRATION BLADDER Height & Weight     Self  Normal Continent Weight: 149 lb 7.6 oz (67.8 kg) Height:  5' 3"  (160 cm)  BEHAVIORAL SYMPTOMS/MOOD NEUROLOGICAL BOWEL NUTRITION STATUS      Continent Diet(Diet: DYS 1 )  AMBULATORY STATUS COMMUNICATION OF NEEDS Skin   Extensive Assist Verbally Surgical wounds                       Personal Care Assistance Level of Assistance  Bathing, Feeding, Dressing Bathing Assistance: Limited assistance Feeding assistance: Independent Dressing Assistance: Limited assistance     Functional Limitations Info  Sight, Hearing, Speech Sight Info: Adequate Hearing Info: Adequate Speech  Info: Adequate    SPECIAL CARE FACTORS FREQUENCY  PT (By licensed PT), OT (By licensed OT)     PT Frequency: (5) OT Frequency: (5)            Contractures      Additional Factors Info  Code Status, Allergies Code Status Info: (DNR ) Allergies Info: (Latex. )           Current Medications (12/11/2017):  This is the current hospital active medication list Current Facility-Administered Medications  Medication Dose Route Frequency Provider Last Rate Last Dose  . 0.9 %  sodium chloride infusion  250 mL Intravenous PRN Salary, Avel Peace, MD   Stopped at 12/07/17 1610  . acetaminophen (TYLENOL) suppository 650 mg  650 mg Rectal Q6H PRN Tukov-Yual, Magdalene S, NP   650 mg at 12/10/17 0928  . acetaminophen (TYLENOL) tablet 650 mg  650 mg Oral Q4H PRN Salary, Montell D, MD      . albuterol (PROVENTIL) (2.5 MG/3ML) 0.083% nebulizer solution 2.5 mg  2.5 mg Nebulization Q4H PRN Wilhelmina Mcardle, MD      . carbidopa-levodopa (SINEMET CR) 50-200 MG per tablet controlled release 0.5 tablet  0.5 tablet Oral BID Salary, Montell D, MD   0.5 tablet at 12/11/17 1230  . carbidopa-levodopa (SINEMET CR) 50-200 MG per tablet controlled release 1 tablet  1 tablet Oral QHS Salary, Montell D, MD      . cefTRIAXone (ROCEPHIN) 1 g in sodium chloride 0.9 % 100 mL IVPB  1 g Intravenous Q24H Wilhelmina Mcardle, MD 200 mL/hr at 12/11/17 1231  1 g at 12/11/17 1231  . chlorhexidine gluconate (MEDLINE KIT) (PERIDEX) 0.12 % solution 15 mL  15 mL Mouth Rinse BID Conforti, John, DO   15 mL at 12/11/17 0740  . darifenacin (ENABLEX) 24 hr tablet 7.5 mg  7.5 mg Oral Daily Salary, Montell D, MD   7.5 mg at 12/11/17 1023  . dextrose 5% lactated ringers 1,000 mL with potassium chloride 40 mEq infusion   Intravenous Continuous Wilhelmina Mcardle, MD 50 mL/hr at 12/11/17 1200    . diltiazem (CARDIZEM) 100 mg in dextrose 5 % 100 mL (1 mg/mL) infusion  5 mg/hr Intravenous Titrated Wilhelmina Mcardle, MD   Stopped at 12/11/17 1215  .  diltiazem (CARDIZEM) tablet 30 mg  30 mg Oral Q8H Samaan, Maged, MD   30 mg at 12/11/17 1116  . enoxaparin (LOVENOX) injection 40 mg  40 mg Subcutaneous Q24H Pernell Dupre, RPH   40 mg at 12/10/17 2154  . hydrALAZINE (APRESOLINE) injection 10 mg  10 mg Intravenous Q2H PRN Tukov-Yual, Magdalene S, NP   10 mg at 12/10/17 0348  . metoprolol tartrate (LOPRESSOR) injection 2.5 mg  2.5 mg Intravenous Q3H PRN Wilhelmina Mcardle, MD   2.5 mg at 12/10/17 5501  . ondansetron (ZOFRAN) injection 4 mg  4 mg Intravenous Q6H PRN Salary, Avel Peace, MD         Discharge Medications: Please see discharge summary for a list of discharge medications.  Relevant Imaging Results:  Relevant Lab Results:   Additional Information (SSN: [id number])  Azaryah Heathcock, Veronia Beets, LCSW

## 2017-12-11 NOTE — Consult Note (Signed)
Reason for Consult:Parkinson's disease and dementia Referring Physician:   CC: Rigidity, gait distrubance  HPI: Kelly Brady is an 84 y.o. female with pertinent history of Parkinson's disease and dementia who presented to the ED with syncope and collapse with LOC. Per daughter, she was walking outside with assistance from one of the family members when she suddenly collapse to the ground. Daughter state that patient was unresponsive so EMS was called. When EMS arrived, she was still unresponsive with pinpoint pupils so Narcan was administered with no improvement. When she arrived in the ED she was intubated for airway protection and transferred to the ICU. Further work up showed acute urinary tract infection and dehydration. CT head and MRI brain was negative. She has since been extubated on room air. She is responding but has difficulty processing information. Daughter state that she has been off Carbidopa- Levodopa 50/200 mg for a few days now. She takes Carbidopa- Levodopa 50/200 mg 0.5 tab in the morning, 0.5 mg in the afternoon and 1 tab at night tolerating well without side effects. Has history of hallucination but no other behavioral disturbances or symptoms to suggest ICD. She has gait difficulty and stiffness at baseline but no reports of gait freezing, drooling, REM sleep behavior disorder or dyskinesias.  Past Medical History:  Diagnosis Date  . Closed fracture of unspecified part of vertebral column without mention of spinal cord injury   . Dementia without behavioral disturbance 01/11/2013  . Disturbance of skin sensation   . Lumbago   . Paralysis agitans ([place])   . Rheumatoid arthritis(714.0)   . Unspecified vitamin D deficiency     Past Surgical History:  Procedure Laterality Date  . CATARACT EXTRACTION Right 2013  . LUMBAR SPINE SURGERY  2008  . MELANOMA EXCISION Left 2013   arm  . VERTEBROPLASTY  2011    Family History  Problem Relation Age of Onset  . Kidney failure  Mother   . Stroke Father     Social History:  reports that she quit smoking about 55 years ago. Her smoking use included cigarettes. She has never used smokeless tobacco. She reports that she does not drink alcohol or use drugs.  Allergies  Allergen Reactions  . Latex Anaphylaxis    Medications:  I have reviewed the patient's current medications. Scheduled: . carbidopa-levodopa  0.5 tablet Oral BID  . carbidopa-levodopa  1 tablet Oral QHS  . chlorhexidine gluconate (MEDLINE KIT)  15 mL Mouth Rinse BID  . darifenacin  7.5 mg Oral Daily  . diltiazem  30 mg Oral Q8H  . enoxaparin (LOVENOX) injection  40 mg Subcutaneous Q24H    ROS: History obtained from the patient   General ROS: negative for - chills, fatigue, fever, night sweats, weight gain or weight loss Psychological ROS: negative for - behavioral disorder, mood swings or suicidal ideation. Positive for - memory difficulty and hallucinations Ophthalmic ROS: negative for - blurry vision, double vision, eye pain or loss of vision ENT ROS: negative for - epistaxis, nasal discharge, oral lesions, sore throat, tinnitus or vertigo Allergy and Immunology ROS: negative for - hives or itchy/watery eyes Hematological and Lymphatic ROS: negative for - bleeding problems, bruising or swollen lymph nodes Endocrine ROS: negative for - galactorrhea, hair pattern changes, polydipsia/polyuria or temperature intolerance Respiratory ROS: negative for - cough, hemoptysis, shortness of breath or wheezing Cardiovascular ROS: negative for - chest pain, dyspnea on exertion, edema or irregular heartbeat Gastrointestinal ROS: negative for - abdominal pain, diarrhea, hematemesis, nausea/vomiting or  stool incontinence Genito-Urinary ROS: negative for - dysuria, hematuria, incontinence or urinary frequency/urgency Musculoskeletal ROS: positive for - Muscle rigidity Neurological ROS: as noted in HPI Dermatological ROS: negative for rash and skin lesion  changes   Physical Examination: Blood pressure 110/61, pulse 100, temperature 98.1 F (36.7 C), temperature source Axillary, resp. rate (!) 22, height _0  (1.6 m), weight 67.8 kg, SpO2 97 %.  General Exam Patient looks appropriate of age, well built, nourished and appropriately groomed.  Cardiovascular Exam: S1, S2 heart sounds present Carotid exam revealed no bruit Lung exam was clear to auscultation  ? Neurological Exam  Mental Status: Alert, Oriented to time, place, person and situation Attention span and concentration seemed appropriate Memory seemed OK. Impaired naming, repetition, comprehension. Moderate bradyphrenia Followed 2 step commands but with difficulty due to bradyphrenia Hypophonia   Cranial Nerves: I. Olfactory not examined II: Visual fields were full. Pupils were equal, round and reactive to light and accommodation III,IV, VI: ptosis not present, extra-ocular motions intact bilaterally V,VII: smile symmetric, facial light touch sensation normal bilaterally VIII: Hard of hearing bilaterally IX, X: Palate and uvular movements are normal and oral sensations are OK, gag reflex deffered XI: Neck muscle strength and shoulder shrug is abnormal due to significant rigidity XII: midline tongue extension  Motor Exam: Significant rigidity/stiffness in all extremities Muscle strength in all extremities is 4/5. No abnormal movements, fasciculations or atrophy seen Bilateral moderate cog-wheeling right >left  Deep Tendon Reflexes: Right Biceps is 2+, Left Biceps is 2+ Right Triceps is 2+, Left Triceps is 2+ Right Brachioradialis is 2+, Left Brachioradialis is 2+ Right Knee Jerk is 2+, Left Knee Jerk is 2+ Right Ankle Jerk is 2+, Left Ankle Jerk is 2+ Right Toes are down going, Left Toes are down going  Sensory Exam: Sensations were intact to light touch in all extremities Vibration and proprioception are also intact  Co-ordination: Unable to perform finger  to nose testing due tot significant bradykinesia  Gait: Gait and station are very unsteady, unable to stand or walk without 2 person assist  Data Reviewed   Laboratory Studies:   Basic Metabolic Panel: Recent Labs  Lab 12/07/17 1142 12/08/17 0457 12/09/17 2034 12/10/17 0441 12/11/17 0452  NA 137 138 135 135 133*  K 3.5 3.2* 3.7 3.3* 3.0*  CL 104 109 106 103 103  CO2 26 23 21* 21* 21*  GLUCOSE 98 99 103* 108* 108*  BUN 33* 29* _1 CREATININE 1.26* 1.04* 0.80 0.75 0.64  CALCIUM 9.0 8.4* 8.5* 8.8* 8.4*  MG  --   --  1.6*  --  1.9  PHOS  --   --  1.8*  --  2.2*    Liver Function Tests: Recent Labs  Lab 12/07/17 1142  AST 18  ALT <5  ALKPHOS 52  BILITOT 0.5  PROT 7.0  ALBUMIN 3.7   No results for input(s): LIPASE, AMYLASE in the last 168 hours. Recent Labs  Lab 12/07/17 1444  AMMONIA 11    CBC: Recent Labs  Lab 12/07/17 1142 12/08/17 0457 12/10/17 0441  WBC 9.2 12.3* 10.1  NEUTROABS 5.7  --   --   HGB 12.2 12.1 12.2  HCT 35.0 35.0 35.0  MCV 87.5 88.9 86.9  PLT 253 261 210    Cardiac Enzymes: Recent Labs  Lab 12/07/17 1142 12/07/17 1906 12/08/17 0102 12/08/17 0457  TROPONINI <0.03 <0.03 <0.03 <0.03    BNP: Invalid input(s): POCBNP  CBG: Recent Labs  Lab 12/07/17  [place]*    Microbiology: Results for orders placed or performed during the hospital encounter of 12/07/17  Urine culture     Status: Abnormal   Collection Time: 12/07/17 11:42 AM  Result Value Ref Range Status   Specimen Description   Final    URINE, RANDOM Performed at [facility], [address], [place], [place] [address]    Special Requests   Final    NONE Performed at [facility], [address] [place], [place] [address]    Culture >=100,000 COLONIES/mL ESCHERICHIA COLI (A)  Final   Report Status 12/09/2017 FINAL  Final   Organism ID, Bacteria ESCHERICHIA COLI (A)  Final      Susceptibility   Escherichia coli - MIC*     AMPICILLIN 8 SENSITIVE Sensitive     CEFAZOLIN <=4 SENSITIVE Sensitive     CEFTRIAXONE <=1 SENSITIVE Sensitive     CIPROFLOXACIN <=0.25 SENSITIVE Sensitive     GENTAMICIN <=1 SENSITIVE Sensitive     IMIPENEM <=0.25 SENSITIVE Sensitive     NITROFURANTOIN <=16 SENSITIVE Sensitive     TRIMETH/SULFA <=20 SENSITIVE Sensitive     AMPICILLIN/SULBACTAM 4 SENSITIVE Sensitive     PIP/TAZO <=4 SENSITIVE Sensitive     Extended ESBL NEGATIVE Sensitive     * >=100,000 COLONIES/mL ESCHERICHIA COLI  MRSA PCR Screening     Status: None   Collection Time: 12/07/17  3:27 PM  Result Value Ref Range Status   MRSA by PCR NEGATIVE NEGATIVE Final    Comment:        The GeneXpert MRSA Assay (FDA approved for NASAL specimens only), is one component of a comprehensive MRSA colonization surveillance program. It is not intended to diagnose MRSA infection nor to guide or monitor treatment for MRSA infections. Performed at [facility], [address] [place], [place] [address]   Culture, blood (routine x 2)     Status: None (Preliminary result)   Collection Time: 12/07/17  4:00 PM  Result Value Ref Range Status   Specimen Description BLOOD BLOOD LEFT WRIST  Final   Special Requests   Final    BOTTLES DRAWN AEROBIC AND ANAEROBIC Blood Culture results may not be optimal due to an inadequate volume of blood received in culture bottles   Culture   Final    NO GROWTH 4 DAYS Performed at [facility], [address] [place], [place] [address]    Report Status PENDING  Incomplete  Culture, blood (routine x 2)     Status: Abnormal   Collection Time: 12/07/17  4:22 PM  Result Value Ref Range Status   Specimen Description   Final    BLOOD LEFT HAND Performed at [facility], [place] [address], [place], [place] [address]    Special Requests   Final    BOTTLES DRAWN AEROBIC AND ANAEROBIC Blood Culture results may not be optimal due to an inadequate volume of blood received in culture  bottles Performed at [facility], [address] [place], [place] [address]    Culture  Setup Time   Final    GRAM POSITIVE COCCI ANAEROBIC BOTTLE ONLY CRITICAL RESULT CALLED TO, READ BACK BY AND VERIFIED WITH: MATT MCBANE _0  12/08/17 AKT    Culture (A)  Final    STAPHYLOCOCCUS SPECIES (COAGULASE NEGATIVE) THE SIGNIFICANCE OF ISOLATING THIS ORGANISM FROM A SINGLE SET OF BLOOD CULTURES WHEN MULTIPLE SETS ARE DRAWN IS UNCERTAIN. PLEASE NOTIFY THE MICROBIOLOGY DEPARTMENT WITHIN ONE WEEK  IF SPECIATION AND SENSITIVITIES ARE REQUIRED. Performed at [facility], [place] [address] [place], [place] [address]    Report Status 12/11/2017 FINAL  Final  Blood Culture ID Panel (Reflexed)     Status: Abnormal   Collection Time: 12/07/17  4:22 PM  Result Value Ref Range Status   Enterococcus species NOT DETECTED NOT DETECTED Final   Listeria monocytogenes NOT DETECTED NOT DETECTED Final   Staphylococcus species DETECTED (A) NOT DETECTED Final    Comment: Methicillin (oxacillin) susceptible coagulase negative staphylococcus. Possible blood culture contaminant (unless isolated from more than one blood culture draw or clinical case suggests pathogenicity). No antibiotic treatment is indicated for blood  culture contaminants. CRITICAL RESULT CALLED TO, READ BACK BY AND VERIFIED WITH: MATT MCBANE _0  12/08/17 AKT    Staphylococcus aureus NOT DETECTED NOT DETECTED Final   Methicillin resistance NOT DETECTED NOT DETECTED Final   Streptococcus species NOT DETECTED NOT DETECTED Final   Streptococcus agalactiae NOT DETECTED NOT DETECTED Final   Streptococcus pneumoniae NOT DETECTED NOT DETECTED Final   Streptococcus pyogenes NOT DETECTED NOT DETECTED Final   Acinetobacter baumannii NOT DETECTED NOT DETECTED Final   Enterobacteriaceae species NOT DETECTED NOT DETECTED Final   Enterobacter cloacae complex NOT DETECTED NOT DETECTED Final   Escherichia coli NOT DETECTED NOT DETECTED Final    Klebsiella oxytoca NOT DETECTED NOT DETECTED Final   Klebsiella pneumoniae NOT DETECTED NOT DETECTED Final   Proteus species NOT DETECTED NOT DETECTED Final   Serratia marcescens NOT DETECTED NOT DETECTED Final   Haemophilus influenzae NOT DETECTED NOT DETECTED Final   Neisseria meningitidis NOT DETECTED NOT DETECTED Final   Pseudomonas aeruginosa NOT DETECTED NOT DETECTED Final   Candida albicans NOT DETECTED NOT DETECTED Final   Candida glabrata NOT DETECTED NOT DETECTED Final   Candida krusei NOT DETECTED NOT DETECTED Final   Candida parapsilosis NOT DETECTED NOT DETECTED Final   Candida tropicalis NOT DETECTED NOT DETECTED Final    Comment: Performed at [facility], [address] [place], [place] [address]    Coagulation Studies: No results for input(s): LABPROT, INR in the last 72 hours.  Urinalysis:  Recent Labs  Lab 12/07/17 1136  COLORURINE YELLOW*  LABSPEC 1.016  PHURINE 5.0  GLUCOSEU NEGATIVE  HGBUR MODERATE*  BILIRUBINUR NEGATIVE  KETONESUR 5*  PROTEINUR 30*  NITRITE POSITIVE*  LEUKOCYTESUR LARGE*    Lipid Panel:  No results found for: CHOL, TRIG, HDL, CHOLHDL, VLDL, LDLCALC  HgbA1C: No results found for: HGBA1C  Urine Drug Screen:      Component Value Date/Time   LABOPIA NONE DETECTED 12/07/2017 1136   COCAINSCRNUR NONE DETECTED 12/07/2017 1136   LABBENZ TEST NOT PERFORMED, REAGENT NOT AVAILABLE (A) 12/07/2017 1136   AMPHETMU NONE DETECTED 12/07/2017 1136   THCU NONE DETECTED 12/07/2017 1136   LABBARB NONE DETECTED 12/07/2017 1136    Alcohol Level:  Recent Labs  Lab 12/07/17 1142  ETH <10    Other results: EKG: normal EKG, normal sinus rhythm, unchanged from previous tracings.  Imaging: Dg Chest Port 1 View  Result Date: 12/10/2017 CLINICAL DATA:  Acute respiratory failure EXAM: PORTABLE CHEST 1 VIEW COMPARISON:  12/08/2017 chest radiograph. FINDINGS: Stable cardiomediastinal silhouette with mild cardiomegaly. No pneumothorax.  Trace bilateral pleural effusions, stable. No overt pulmonary edema. Increased patchy lower parahilar opacities in both lungs. IMPRESSION: Increased patchy lower parahilar opacities in both lungs, which could represent atelectasis, aspiration and/or pneumonia. Stable cardiomegaly without overt pulmonary edema. Trace bilateral pleural effusions, stable. Electronically  Signed   By: Ilona Sorrel M.D.   On: 12/10/2017 08:00   Patient seen and examined.  Clinical course and management discussed.  Necessary edits performed.  I agree with the above.  Assessment and plan of care developed and discussed below.     Assessment: 84 yo female with syncope and collapse.  LOC presumed due to acute UTI and dehydration. Seen at the request of daughter for medication management of Parkinson's disease and dementia. She has not seen a neurologist in 2 years for PD and Dementia follow up. She is currently on carbidopa- levodopa 50/200 mg 0.5 tab in the morning, 0.5 mg in the afternoon and 1 tab at night and tolerating well.  She has been tried on higher doses up to 2 tabs TID but this worsened dyskinesias. She is noted to have significant stiffness, bradykinesia, cogwheeling in upper extremities and bradyphrenia likely due to being off medication. She has been on Mirapex but this worsened dyskinesias and caused hallucinations as well. She has been tried on Aricept, Exelon and Namenda for cognitive difficulties but was unable to tolerate either one of these.  Plan 1. Patient currently very hypertonic and bradykinetic but has only had one dose of her Sinemet about an hour.  Would continue home dose of Sinemet and will re-evaluate in AM.  Will make recommendations on need to increase dosage at that time.   2. Recommend re-establishing care with outpatient Neurology for management of Parkinson and dementia.  In the setting of acute illness treatment and evaluation of dementia for long term management not indicated.   3. Continue  PT/OT 4. High risk for falls, maintain falls risk precautions 5. Unclear if orthostasis may have contributed in any way to presentation.  Will perform orthostatic blood pressure testing prior to any medication changes and would recommend with therapy and any attempts to keep up for prolonged periods of time.     This patient was staffed with Dr. Magda Paganini, Doy Mince who personally evaluated patient, reviewed documentation and agreed with assessment and plan of care as above.  Rufina Falco, DNP, FNP-BC Board certified Nurse Practitioner Neurology Department  12/11/2017, 12:30 PM     Alexis Goodell, MD Neurology [phone]  12/11/2017  3:11 PM

## 2017-12-11 NOTE — Clinical Social Work Placement (Signed)
   CLINICAL SOCIAL WORK PLACEMENT  NOTE  Date:  12/11/2017  Patient Details  Name: MERIAM CHOJNOWSKI MRN: [id number] Date of Birth: [birth year]  Clinical Social Work is seeking post-discharge placement for this patient at the [place] level of care (*CSW will initial, date and re-position this form in  chart as items are completed):  Yes   Patient/family provided with [place] Work Department's list of facilities offering this level of care within the geographic area requested by the patient (or if unable, by the patient's family).  Yes   Patient/family informed of their freedom to choose among providers that offer the needed level of care, that participate in Medicare, Medicaid or managed care program needed by the patient, have an available bed and are willing to accept the patient.  Yes   Patient/family informed of [facility]'s ownership interest in [facility] and [facility], as well as of the fact that they are under no obligation to receive care at these facilities.  PASRR submitted to EDS on 12/11/17     PASRR number received on 12/11/17     Existing PASRR number confirmed on       FL2 transmitted to all facilities in geographic area requested by pt/family on 12/11/17     FL2 transmitted to all facilities within larger geographic area on       Patient informed that his/her managed care company has contracts with or will negotiate with certain facilities, including the following:        Yes   Patient/family informed of bed offers received.  Patient chooses bed at ([facility] )     Physician recommends and patient chooses bed at      Patient to be transferred to   on  .  Patient to be transferred to facility by       Patient family notified on   of transfer.  Name of family member notified:        PHYSICIAN       Additional Comment:    _______________________________________________ Demonta Wombles, Veronia Beets, LCSW 12/11/2017, 4:17  PM

## 2017-12-11 NOTE — Progress Notes (Signed)
Continue on Cardizem gtt at 7.5. Has been awake and restless most of the shift, but sleepy now this AM. Family member was in to see patient. No concerns at this time. Remains on nasal cannula without respiratory distress. Continue to monitor.

## 2017-12-12 ENCOUNTER — Inpatient Hospital Stay: Payer: Medicare Other

## 2017-12-12 LAB — CULTURE, BLOOD (ROUTINE X 2): Culture: NO GROWTH

## 2017-12-12 MED ORDER — CARBIDOPA-LEVODOPA ER 25-100 MG PO TBCR
1.0000 | EXTENDED_RELEASE_TABLET | Freq: Three times a day (TID) | ORAL | Status: DC
  Administered 2017-12-12 – 2017-12-13 (×3): 1 via ORAL
  Filled 2017-12-12 (×5): qty 1

## 2017-12-12 MED ORDER — CARBIDOPA-LEVODOPA ER 50-200 MG PO TBCR
1.0000 | EXTENDED_RELEASE_TABLET | Freq: Two times a day (BID) | ORAL | Status: DC
  Administered 2017-12-13 (×2): 1 via ORAL
  Filled 2017-12-12 (×3): qty 1

## 2017-12-12 NOTE — Progress Notes (Signed)
[facility] [place] NAME: Kelly Brady    MR#:  [id number]  DATE OF BIRTH:  Jan 02, 1931  SUBJECTIVE:  CHIEF COMPLAINT:   Chief Complaint  Patient presents with  . Respiratory Distress  Patient seen and evaluated today On oxygen via nasal cannula Lethargic with decreased responsiveness Poor oral intake  REVIEW OF SYSTEMS:    ROS Could not be obtained secondary to lethargy and confusion  DRUG ALLERGIES:   Allergies  Allergen Reactions  . Latex Anaphylaxis    VITALS:  Blood pressure (!) 163/75, pulse (!) 116, temperature 98.4 F (36.9 C), resp. rate 16, height 5\' 3"  (1.6 m), weight 65 kg, SpO2 91 %.  PHYSICAL EXAMINATION:   Physical Exam  GENERAL:  84 y.o.-year-old patient lying in the bed  EYES: Pupils equal, round, reactive to light and accommodation. No scleral icterus. Extraocular muscles intact.  HEENT: Head atraumatic, normocephalic. Oropharynx and nasopharynx clear.  NECK:  Supple, no jugular venous distention. No thyroid enlargement, no tenderness.  LUNGS: Decreased breath sounds bilaterally, basal crepitations heard. No use of accessory muscles of respiration.  CARDIOVASCULAR: S1, S2 normal. No murmurs, rubs, or gallops.  ABDOMEN: Soft, nontender, nondistended. Bowel sounds present. No organomegaly or mass.  EXTREMITIES: No cyanosis, clubbing or edema b/l.    NEUROLOGIC: Not oriented to time, place and person Moves extremities PSYCHIATRIC: could not be assessed SKIN: No obvious rash, lesion, or ulcer.   LABORATORY PANEL:   CBC Recent Labs  Lab 12/10/17 0441  WBC 10.1  HGB 12.2  HCT 35.0  PLT 210   ------------------------------------------------------------------------------------------------------------------ Chemistries  Recent Labs  Lab 12/07/17 1142  12/11/17 0452 12/11/17 1635  NA 137   < > 133*  --   K 3.5   < > 3.0* 3.6  CL 104   < > 103  --   CO2 26   < > 21*  --   GLUCOSE 98   < > 108*   --   BUN 33*   < > 16  --   CREATININE 1.26*   < > 0.64  --   CALCIUM 9.0   < > 8.4*  --   MG  --    < > 1.9  --   AST 18  --   --   --   ALT <5  --   --   --   ALKPHOS 52  --   --   --   BILITOT 0.5  --   --   --    < > = values in this interval not displayed.   ------------------------------------------------------------------------------------------------------------------  Cardiac Enzymes Recent Labs  Lab 12/08/17 0457  TROPONINI <0.03   ------------------------------------------------------------------------------------------------------------------  RADIOLOGY:  Dg Chest Port 1 View  Result Date: 12/12/2017 CLINICAL DATA:  Follow-up pneumonia, former smoker. History of rheumatoid arthritis. EXAM: PORTABLE CHEST 1 VIEW COMPARISON:  Portable chest x-ray of December 10, 2017 FINDINGS: The lungs are reasonably well inflated. Bibasilar densities are present and have become more confluent. The hemidiaphragms are obscured. The heart is top-normal in size. The pulmonary vascularity is mildly prominent centrally. There is calcification in the wall of the aortic arch and descending thoracic aorta. There is thoracolumbar scoliosis. IMPRESSION: Progressive bibasilar densities may reflect atelectasis or pneumonia, and possibly superimposed pleural effusions. Mild central pulmonary vascular prominence without significant cardiomegaly. Thoracic aortic atherosclerosis. Electronically Signed   By: David  Martinique M.D.   On: 12/12/2017 09:05     ASSESSMENT AND PLAN:  84 year old elderly female patient with history of Parkinson's disease Alzheimer's dementia currently in the ICU for unresponsiveness and respiratory failure  -Status post respiratory failure Off ventilator since three days Monitor oxygen saturation DNR  - Acute Encephalopathy Appears metabolic  -Adult failure to thrive  -E. coli urinary tract infection Continue IV Rocephin antibiotic  -Parkinson's disease with  dementia Neurology evaluation appreciated Continue oral Sinemet  -Alzheimer's dementia Supportive care  -DVT prophylaxis subcu Lovenox daily  -Blood culture revealed Staphylococcus coagulase-negative Contaminant  -Palliative medicine consult for evaluation for hospice Long term prognosis poor in view of multiple comorbidities   All the records are reviewed and case discussed with Care Management/Social Worker. Management plans discussed with the patient, family and they are in agreement.  CODE STATUS: Full code  DVT Prophylaxis: SCDs  TOTAL TIME TAKING CARE OF THIS PATIENT: 32 minutes.   POSSIBLE D/C IN 3 to 4 DAYS, DEPENDING ON CLINICAL CONDITION.  Saundra Shelling M.D on 12/12/2017 at 1:47 PM  Between 7am to 6pm - Pager - [phone]  After 6pm go to [email or web address] - password [email or web address] [facility]  Office  [phone]  CC: Primary care physician; Lavera Guise, MD  Note: This dictation was prepared with Dragon dictation along with smaller phrase technology. Any transcriptional errors that result from this process are unintentional.

## 2017-12-12 NOTE — Progress Notes (Signed)
Per Tammy admissions coordinator at [facility] authorization has been received. Clinical Social Worker (CSW) contacted patient's daughter Kelly Brady and made her aware of above. Per Kelly Brady patient may need a palliative consult which the doctor will determine tomorrow.   [employer], LCSW [phone]

## 2017-12-12 NOTE — Progress Notes (Signed)
OT Cancellation Note  Patient Details Name: Kelly Brady MRN: [id number] DOB: 15-May-1930   Cancelled Treatment:    Reason Eval/Treat Not Completed: Patient's level of consciousness;Fatigue/lethargy limiting ability to participate  Pt with limited arousal, responsiveness, and ability to meaningfully participate in therapy this date d/t lethargy. RN and pt's daughter request hold at this time. Will f/u as pt becomes more appropriate.   Gerrianne Scale, MS, OTR/L ascom [phone] or [phone] 12/12/17, 4:31 PM

## 2017-12-12 NOTE — Progress Notes (Signed)
SLP Cancellation Note  Patient Details Name: Kelly Brady MRN: [id number] DOB: March 09, 1931   Cancelled treatment:       Reason Eval/Treat Not Completed: Patient not medically ready(chart reviewed; MD notes). Per MD notes, patient remains significantly hypertonic. Min engagement intermittently only w/ unable to eat but taking po meds Crushed in puree w/ NSG today. Will require adjustments of Parkinsonian medications(sinemet) per Neurology. ST services will f/u next 1-2 days for trials to upgrade diet consistency when pt is able to participate more fully to safely manage the po consistencies.    Orinda Kenner, MS, CCC-SLP Watson,Katherine 12/12/2017, 3:30 PM

## 2017-12-12 NOTE — Progress Notes (Signed)
Patient resting in the bed.ily at bedside, VSS, iv fluid infusing , aspiration precaution, turned and repositioned frequently

## 2017-12-12 NOTE — Care Management (Signed)
Barrier- unable to upgrade diet. Parkinson meds being adjusted.  Palliative consult ordered.  [facility]auf[facility] has been obtained for SNF

## 2017-12-12 NOTE — Care Management Important Message (Signed)
Copy of signed IM left with patient and family in room. 

## 2017-12-12 NOTE — Progress Notes (Signed)
Subjective: Patient remains stiff.  Unable to move significantly in bed.  Has bee  Unable to eat but taking medications.  No imporvement noted with Sinemet today.    Objective: Current vital signs: BP (!) 163/75 (BP Location: Right Arm)   Pulse (!) 116   Temp 98.4 F (36.9 C)   Resp 16   Ht 5' 3"  (1.6 m)   Wt 65 kg   SpO2 91%   BMI 25.40 kg/m  Vital signs in last 24 hours: Temp:  [97.6 F (36.4 C)-98.8 F (37.1 C)] 98.4 F (36.9 C) (08/27 1132) Pulse Rate:  [75-116] 116 (08/27 0746) Resp:  [16-18] 16 (08/27 0516) BP: (131-163)/(75-81) 163/75 (08/27 0746) SpO2:  [91 %-96 %] 91 % (08/27 0746) Weight:  [65 kg] 65 kg (08/27 0516)  Intake/Output from previous day: 08/26 0701 - 08/27 0700 In: 1343.7 [I.V.:875.6; IV Piggyback:468.1] Out: 275 [Urine:275] Intake/Output this shift: Total I/O In: 120 [P.O.:120] Out: -  Nutritional status:  Diet Order            DIET - DYS 1 Room service appropriate? Yes; Fluid consistency: Honey Thick  Diet effective now              Neurologic Exam: Mental Status: Alert.  Speech slow and dysarthric.  Follows simple commands. . Cranial Nerves: II: Discs flat bilaterally; Blinks to bilateral confrontation III,IV, VI: ptosis not present, extra-ocular motions intact bilaterally V,VII: left facial droop VIII: hearing normal bilaterally IX,X: gag reflex present XI: left torticollis XII: midline tongue extension Motor: Increased tone throughout.  Moves right greater then left.   Sensory: Pinprick and light touch intact throughout, bilaterally   Lab Results: Basic Metabolic Panel: Recent Labs  Lab 12/07/17 1142 12/08/17 0457 12/09/17 2034 12/10/17 0441 12/11/17 0452 12/11/17 1635  NA 137 138 135 135 133*  --   K 3.5 3.2* 3.7 3.3* 3.0* 3.6  CL 104 109 106 103 103  --   CO2 26 23 21* 21* 21*  --   GLUCOSE 98 99 103* 108* 108*  --   BUN 33* 29* 15 14 16   --   CREATININE 1.26* 1.04* 0.80 0.75 0.64  --   CALCIUM 9.0 8.4* 8.5*  8.8* 8.4*  --   MG  --   --  1.6*  --  1.9  --   PHOS  --   --  1.8*  --  2.2*  --     Liver Function Tests: Recent Labs  Lab 12/07/17 1142  AST 18  ALT <5  ALKPHOS 52  BILITOT 0.5  PROT 7.0  ALBUMIN 3.7   No results for input(s): LIPASE, AMYLASE in the last 168 hours. Recent Labs  Lab 12/07/17 1444  AMMONIA 11    CBC: Recent Labs  Lab 12/07/17 1142 12/08/17 0457 12/10/17 0441  WBC 9.2 12.3* 10.1  NEUTROABS 5.7  --   --   HGB 12.2 12.1 12.2  HCT 35.0 35.0 35.0  MCV 87.5 88.9 86.9  PLT 253 261 210    Cardiac Enzymes: Recent Labs  Lab 12/07/17 1142 12/07/17 1906 12/08/17 0102 12/08/17 0457  TROPONINI <0.03 <0.03 <0.03 <0.03    Lipid Panel: No results for input(s): CHOL, TRIG, HDL, CHOLHDL, VLDL, LDLCALC in the last 168 hours.  CBG: Recent Labs  Lab 12/07/17 9794  GLUCAP 123*    Microbiology: Results for orders placed or performed during the hospital encounter of 12/07/17  Urine culture     Status: Abnormal   Collection Time:  12/07/17 11:42 AM  Result Value Ref Range Status   Specimen Description   Final    URINE, RANDOM Performed at [facility], [address] [place], [place] [address]    Special Requests   Final    NONE Performed at [facility], [address] [place], [place] [address]    Culture >=100,000 COLONIES/mL ESCHERICHIA COLI (A)  Final   Report Status 12/09/2017 FINAL  Final   Organism ID, Bacteria ESCHERICHIA COLI (A)  Final      Susceptibility   Escherichia coli - MIC*    AMPICILLIN 8 SENSITIVE Sensitive     CEFAZOLIN <=4 SENSITIVE Sensitive     CEFTRIAXONE <=1 SENSITIVE Sensitive     CIPROFLOXACIN <=0.25 SENSITIVE Sensitive     GENTAMICIN <=1 SENSITIVE Sensitive     IMIPENEM <=0.25 SENSITIVE Sensitive     NITROFURANTOIN <=16 SENSITIVE Sensitive     TRIMETH/SULFA <=20 SENSITIVE Sensitive     AMPICILLIN/SULBACTAM 4 SENSITIVE Sensitive     PIP/TAZO <=4 SENSITIVE Sensitive     Extended ESBL NEGATIVE  Sensitive     * >=100,000 COLONIES/mL ESCHERICHIA COLI  MRSA PCR Screening     Status: None   Collection Time: 12/07/17  3:27 PM  Result Value Ref Range Status   MRSA by PCR NEGATIVE NEGATIVE Final    Comment:        The GeneXpert MRSA Assay (FDA approved for NASAL specimens only), is one component of a comprehensive MRSA colonization surveillance program. It is not intended to diagnose MRSA infection nor to guide or monitor treatment for MRSA infections. Performed at [facility], [address] [place], [place] [address]   Culture, blood (routine x 2)     Status: None   Collection Time: 12/07/17  4:00 PM  Result Value Ref Range Status   Specimen Description BLOOD BLOOD LEFT WRIST  Final   Special Requests   Final    BOTTLES DRAWN AEROBIC AND ANAEROBIC Blood Culture results may not be optimal due to an inadequate volume of blood received in culture bottles   Culture   Final    NO GROWTH 5 DAYS Performed at [facility], [address] [place], [place] [address]    Report Status 12/12/2017 FINAL  Final  Culture, blood (routine x 2)     Status: Abnormal   Collection Time: 12/07/17  4:22 PM  Result Value Ref Range Status   Specimen Description   Final    BLOOD LEFT HAND Performed at [facility], [place] [address] [place], [place] [address]    Special Requests   Final    BOTTLES DRAWN AEROBIC AND ANAEROBIC Blood Culture results may not be optimal due to an inadequate volume of blood received in culture bottles Performed at [facility], [address] [place], [place] [address]    Culture  Setup Time   Final    GRAM POSITIVE COCCI ANAEROBIC BOTTLE ONLY CRITICAL RESULT CALLED TO, READ BACK BY AND VERIFIED WITH: MATT MCBANE @2311  12/08/17 AKT    Culture (A)  Final    STAPHYLOCOCCUS SPECIES (COAGULASE NEGATIVE) THE SIGNIFICANCE OF ISOLATING THIS ORGANISM FROM A SINGLE SET OF BLOOD CULTURES WHEN MULTIPLE SETS ARE DRAWN IS UNCERTAIN. PLEASE  NOTIFY THE MICROBIOLOGY DEPARTMENT WITHIN ONE WEEK IF SPECIATION AND SENSITIVITIES ARE REQUIRED. Performed at [facility], [place] [address] [place], [place] [address]    Report Status 12/11/2017 FINAL  Final  Blood Culture ID Panel (Reflexed)  Status: Abnormal   Collection Time: 12/07/17  4:22 PM  Result Value Ref Range Status   Enterococcus species NOT DETECTED NOT DETECTED Final   Listeria monocytogenes NOT DETECTED NOT DETECTED Final   Staphylococcus species DETECTED (A) NOT DETECTED Final    Comment: Methicillin (oxacillin) susceptible coagulase negative staphylococcus. Possible blood culture contaminant (unless isolated from more than one blood culture draw or clinical case suggests pathogenicity). No antibiotic treatment is indicated for blood  culture contaminants. CRITICAL RESULT CALLED TO, READ BACK BY AND VERIFIED WITH: MATT MCBANE @2311  12/08/17 AKT    Staphylococcus aureus NOT DETECTED NOT DETECTED Final   Methicillin resistance NOT DETECTED NOT DETECTED Final   Streptococcus species NOT DETECTED NOT DETECTED Final   Streptococcus agalactiae NOT DETECTED NOT DETECTED Final   Streptococcus pneumoniae NOT DETECTED NOT DETECTED Final   Streptococcus pyogenes NOT DETECTED NOT DETECTED Final   Acinetobacter baumannii NOT DETECTED NOT DETECTED Final   Enterobacteriaceae species NOT DETECTED NOT DETECTED Final   Enterobacter cloacae complex NOT DETECTED NOT DETECTED Final   Escherichia coli NOT DETECTED NOT DETECTED Final   Klebsiella oxytoca NOT DETECTED NOT DETECTED Final   Klebsiella pneumoniae NOT DETECTED NOT DETECTED Final   Proteus species NOT DETECTED NOT DETECTED Final   Serratia marcescens NOT DETECTED NOT DETECTED Final   Haemophilus influenzae NOT DETECTED NOT DETECTED Final   Neisseria meningitidis NOT DETECTED NOT DETECTED Final   Pseudomonas aeruginosa NOT DETECTED NOT DETECTED Final   Candida albicans NOT DETECTED NOT DETECTED Final   Candida glabrata NOT  DETECTED NOT DETECTED Final   Candida krusei NOT DETECTED NOT DETECTED Final   Candida parapsilosis NOT DETECTED NOT DETECTED Final   Candida tropicalis NOT DETECTED NOT DETECTED Final    Comment: Performed at [facility], [address] [place], [place] [address]    Coagulation Studies: No results for input(s): LABPROT, INR in the last 72 hours.  Imaging: Dg Chest Port 1 View  Result Date: 12/12/2017 CLINICAL DATA:  Follow-up pneumonia, former smoker. History of rheumatoid arthritis. EXAM: PORTABLE CHEST 1 VIEW COMPARISON:  Portable chest x-ray of December 10, 2017 FINDINGS: The lungs are reasonably well inflated. Bibasilar densities are present and have become more confluent. The hemidiaphragms are obscured. The heart is top-normal in size. The pulmonary vascularity is mildly prominent centrally. There is calcification in the wall of the aortic arch and descending thoracic aorta. There is thoracolumbar scoliosis. IMPRESSION: Progressive bibasilar densities may reflect atelectasis or pneumonia, and possibly superimposed pleural effusions. Mild central pulmonary vascular prominence without significant cardiomegaly. Thoracic aortic atherosclerosis. Electronically Signed   By: David  Martinique M.D.   On: 12/12/2017 09:05    Medications:  I have reviewed the patient's current medications. Scheduled: . carbidopa-levodopa  1 tablet Oral QHS  . [START ON 12/13/2017] carbidopa-levodopa  1 tablet Oral BID  . Carbidopa-Levodopa ER  1 tablet Oral TID  . chlorhexidine gluconate (MEDLINE KIT)  15 mL Mouth Rinse BID  . darifenacin  7.5 mg Oral Daily  . diltiazem  30 mg Oral Q8H  . enoxaparin (LOVENOX) injection  40 mg Subcutaneous Q24H    Assessment/Plan: Patient remains significantly hypertonic.  Will require adjustments of Parkinsonian medications.    Recommendations: 1.  Increase Sinemet CR to 50/200 TID 2.  Start immediate release Sinemet at 25/100 TID.  Timing written with order 3.   Once patient able to stand would perform orthostatics. 4.  Continue therapy   LOS: 5 days   Alexis Goodell, MD Neurology  [phone] 12/12/2017  3:05 PM

## 2017-12-13 DIAGNOSIS — R4182 Altered mental status, unspecified: Secondary | ICD-10-CM

## 2017-12-13 DIAGNOSIS — Z515 Encounter for palliative care: Secondary | ICD-10-CM

## 2017-12-13 DIAGNOSIS — Z66 Do not resuscitate: Secondary | ICD-10-CM

## 2017-12-13 LAB — BASIC METABOLIC PANEL
Anion gap: 9 (ref 5–15)
BUN: 9 mg/dL (ref 8–23)
CALCIUM: 9 mg/dL (ref 8.9–10.3)
CO2: 24 mmol/L (ref 22–32)
CREATININE: 0.71 mg/dL (ref 0.44–1.00)
Chloride: 106 mmol/L (ref 98–111)
GFR calc Af Amer: >60 mL/min (ref >60)
GFR calc non Af Amer: >60 mL/min (ref >60)
GLUCOSE: 101 mg/dL — AB (ref 70–99)
Potassium: 3.8 mmol/L (ref 3.5–5.1)
Sodium: 139 mmol/L (ref 135–145)

## 2017-12-13 LAB — CBC
HCT: 34.6 % — ABNORMAL LOW (ref 35.0–47.0)
Hemoglobin: 12.1 g/dL (ref 12.0–16.0)
MCH: 30.4 pg (ref 26.0–34.0)
MCHC: 34.8 g/dL (ref 32.0–36.0)
MCV: 87.4 fL (ref 80.0–100.0)
Platelets: 258 10*3/uL (ref 150–440)
RBC: 3.96 MIL/uL (ref 3.80–5.20)
RDW: 14 % (ref 11.5–14.5)
WBC: 7 10*3/uL (ref 3.6–11.0)

## 2017-12-13 MED ORDER — CARBIDOPA-LEVODOPA 25-100 MG PO TABS
2.0000 | ORAL_TABLET | Freq: Every day | ORAL | Status: DC
  Administered 2017-12-13 – 2017-12-14 (×2): 2 via ORAL
  Filled 2017-12-13 (×4): qty 2

## 2017-12-13 MED ORDER — CARBIDOPA-LEVODOPA 25-100 MG PO TABS
1.0000 | ORAL_TABLET | Freq: Three times a day (TID) | ORAL | Status: DC
  Administered 2017-12-14 – 2017-12-15 (×4): 1 via ORAL
  Filled 2017-12-13 (×6): qty 1

## 2017-12-13 MED ORDER — GLYCOPYRROLATE 0.2 MG/ML IJ SOLN
0.2000 mg | INTRAMUSCULAR | Status: DC | PRN
  Filled 2017-12-13: qty 1

## 2017-12-13 MED ORDER — ONDANSETRON HCL 4 MG/2ML IJ SOLN: 4.0000 mg | Freq: Four times a day (QID) | INTRAMUSCULAR | Status: DC | PRN

## 2017-12-13 MED ORDER — GLYCERIN-HYPROMELLOSE-PEG 400 0.2-0.2-1 % OP SOLN
1.0000 [drp] | Freq: Four times a day (QID) | OPHTHALMIC | Status: DC | PRN
  Filled 2017-12-13: qty 15

## 2017-12-13 MED ORDER — MORPHINE SULFATE (CONCENTRATE) 10 MG/0.5ML PO SOLN
5.0000 mg | ORAL | Status: DC | PRN
  Administered 2017-12-14: 5 mg via ORAL
  Filled 2017-12-13 (×2): qty 1

## 2017-12-13 MED ORDER — BIOTENE DRY MOUTH MT LIQD: 15.0000 mL | OROMUCOSAL | Status: DC | PRN

## 2017-12-13 MED ORDER — CARBIDOPA-LEVODOPA 25-100 MG PO TABS
2.0000 | ORAL_TABLET | Freq: Two times a day (BID) | ORAL | Status: DC
  Administered 2017-12-14 – 2017-12-15 (×3): 2 via ORAL
  Filled 2017-12-13 (×4): qty 2

## 2017-12-13 MED ORDER — ONDANSETRON 4 MG PO TBDP
4.0000 mg | ORAL_TABLET | Freq: Four times a day (QID) | ORAL | Status: DC | PRN
  Filled 2017-12-13: qty 1

## 2017-12-13 MED ORDER — LORAZEPAM 2 MG/ML PO CONC: 0.5000 mg | ORAL | Status: DC | PRN

## 2017-12-13 MED ORDER — LORAZEPAM 2 MG/ML IJ SOLN
0.5000 mg | INTRAMUSCULAR | Status: DC | PRN
  Administered 2017-12-14: 0.5 mg via INTRAVENOUS
  Filled 2017-12-13: qty 1

## 2017-12-13 MED ORDER — MORPHINE SULFATE (CONCENTRATE) 10 MG/0.5ML PO SOLN
5.0000 mg | ORAL | Status: DC | PRN
  Administered 2017-12-15 (×3): 5 mg via SUBLINGUAL
  Filled 2017-12-13 (×2): qty 1

## 2017-12-13 NOTE — Clinical Social Work Note (Addendum)
CSW received consult that patient will need hospice facility placement.  CSW contacted patient's daughter and spoke to her regarding choice of hospice facility, and patient's daughter stated she would like [facility].  CSW contacted nurse liaison and made referral, nurse liaison, stated there is currently a wait list for hospice home.  CSW updated palliative team about the wait list, patient is being transitioned to comfort care.  CSW informed patient's family that patient will have to be on a wait list because there are currently not beds available.  Jones Broom. [place], MSW, [place]  12/13/2017 2:00 PM

## 2017-12-13 NOTE — Consult Note (Signed)
Consultation Note Date: 12/13/2017   Patient Name: Kelly Brady  DOB: 1930/12/22  MRN: [id number]  Age / Sex: 84 y.o., female  PCP: Lavera Guise, MD Referring Physician: Saundra Shelling, MD  Reason for Consultation: Establishing goals of care, Non pain symptom management, Pain control and Psychosocial/spiritual support  HPI/Patient Profile: 84 y.o. female admitted on 12/07/2017 from home after collapsing and becoming unresponsive. She has a past medical history significant for Parkinson's disease, dementia, chronic ataxia, and rheumatoid arthritis. Patient lives at home with husband and was brought in after collapsing to the floor. On ED presentation patient required emergent intubation for airway protection and found to be severely obtunded. During her ED course she was found to be dehydrated, and [place] suspicious for UTI. UA showed large leukocytes, positive nitrates, and 30 Protein. Creatinine 1.2 EKG showed ST depression laterally with T wave inversion and RBBB. Family was present at bedside. Since admission patient has been extubated. She remains lethargic with minimum response. She has been seen by Neurology. Palliative Medicine team consulted for goals of care.   Clinical Assessment and Goals of Care: I have reviewed medical records including lab results, imaging, Epic notes, and MAR, received report from the bedside RN, and assessed the patient. I then met at the bedside with patient's daughter [name] and patient's husband, [name] to discuss diagnosis prognosis, [place], EOL wishes, disposition and options. Patient is lethargic with no response. She has a history of dementia. [name] is unable to engage appropriately in goals of care discussion.   I introduced Palliative Medicine as specialized medical care for people living with serious illness. It focuses on providing relief from the symptoms and stress of a  serious illness. The goal is to improve quality of life for both the patient and the family.  We discussed a brief life review of the patient.  According to patient's daughter, patient and her husband have been married for more than 65 years. They have 2 children (son and daughter). 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    As far as functional and nutritional status family reports patient was generally able to ambulate with assistance and a walker. Daughter verbalizes she has noticed that her mom has become somewhat more weaker over the past few weeks. She also reports that family has noticed a decrease in her appetite. Patient has not eaten or drank much in the past 1-2 weeks. She continues to refuse meals in the hospital also. She will take some medications crushed in applesauce at times. Daughter, reports that she feels her mom has given up and no longer has the will to live.   We discussed her current illness and what it means in the larger context of her on-going co-morbidities.  Natural disease trajectory and expectations at EOL were discussed. Family verbalizes their understanding of patient's current conditions and co-morbidities. They again verbalizes that patient has loss her will to live and feels that she is prepared to go to  heaven. Family reports they would like for her to be kept comfortable as they have seen the decline in her status over the past few months and do not want her to suffer.   Family states that she has no good quality of life and that has always been what is important to her. They feel that it is time to allow her the respect and comfort of passing away peacefully.   I attempted to elicit values and goals of care important to the patient and the family.     The difference between aggressive medical intervention and comfort care was considered in light of the patient's goals of care. Given family has expressed their wishes  for [name] to be kept comfortable for end-of-life transition we discussed in detail what comfort care measures would look like.  Family is aware that medical staff will continue to provide great care to the patient while focusing on any symptoms that she may have such as nausea, vomiting, pain, discomfort, agitation/anxiety, or shortness of breath.  Family is aware that aggressive measures such as lab work, radiology testing, or medications not focused on comfort would be involved in her plan of care.  Family verbalized understanding and appreciation.  Daughter and husband confirm that patient is a DNR/DNI.  They verbalized patient would not want to continue to live in her current state and has always expressed her wishes of not having any forms of artificial feedings such as PEG tube.  Family is requesting that patient be transferred to [facility] for end-of-life care if possible.  We discussed hospice care in the home's philosophy when caring for patients at end of life.  Family verbalized understanding and appreciation and expressed this is what they would like for their loved one.  Family was advised that at this time there not an available bed at the hospice facility, and that the patient will remain here in the hospital and receive comfort care measures until a bed becomes available for patient at the hospice home.  Family was educated that patient could potentially have a hospital death depending on the timeframe that a bed becomes available at residential hospice.  Family verbalized understanding.  Questions and concerns were addressed. The family was encouraged to call with questions or concerns.  PMT will continue to support holistically.   Primary Decision Maker:  HCPOA- Lanice Folden (husband) and Cindy Martinique (Daughter)     SUMMARY OF RECOMMENDATIONS    DNR/DNI-as confirmed by patient's family  Full comfort measures while hospitalized.  Family is hopeful  patient will get a bed at the local hospice home facility, however they are aware that patient will remain in the hospital and potentially be transferred to [facility] for end-of-life care.  They are aware that the hospice home currently has a waiting list.  Family is requesting patient be placed at [facility] once bed is available.  CSW, Randall Hiss and Santiago Glad, RN (hospice liaison) is aware of family's request for residential hospice.  Tylenol PRN for fever  Banal PRN for excessive secretions  Ativan as needed for anxiety/agitation  Morphine as needed for pain and shortness of breath  Zofran as needed for nausea/vomiting  She may have comfort feedings.  She may also have Coke if requested.  Family is aware that patient is at high risk for aspiration, however at this time with her being comfort they would like to proceed with providing her things of pleasure if she is awake and able  to take by mouth.  Palliative medicine team will continue to support patient, family, medical team during hospitalization.  Code Status/Advance Care Planning:  DNR/DNI   Symptom Management:   Tylenol PRN for fever  Banal PRN for excessive secretions  Ativan as needed for anxiety/agitation  Morphine as needed for pain and shortness of breath  Zofran as needed for nausea/vomiting  She may have comfort feedings.  She may also have Coke if requested.  Family is aware that patient is at high risk for aspiration, however at this time with her being comfort they would like to proceed with providing her things of pleasure if she is awake and able to take by mouth..   Palliative Prophylaxis:   Aspiration, Bowel Regimen, Eye Care, Frequent Pain Assessment, Oral Care and Turn Reposition  Additional Recommendations (Limitations, Scope, Preferences):  Full Comfort Care  Psycho-social/Spiritual:   Desire for further Chaplaincy support:no  Prognosis:   < 2 weeks-in the setting of advanced  dementia, rheumatoid arthritis, immobility, respiratory failure, hypertension, poor p.o. intake, comfort measures only, and Parkinson's disease.  Discharge Planning: Hospice facility      Primary Diagnoses: Present on Admission: . Respiratory failure ([place])   I have reviewed the medical record, interviewed the patient and family, and examined the patient. The following aspects are pertinent.  Past Medical History:  Diagnosis Date  . Closed fracture of unspecified part of vertebral column without mention of spinal cord injury   . Dementia without behavioral disturbance 01/11/2013  . Disturbance of skin sensation   . Lumbago   . Paralysis agitans ([place])   . Rheumatoid arthritis(714.0)   . Unspecified vitamin D deficiency    Social History   Socioeconomic History  . Marital status: Married    Spouse name: [name]  . Number of children: 2  . Years of education: College  . Highest education level: Not on file  Occupational History  . Occupation: Retired  Scientific laboratory technician  . Financial resource strain: Not on file  . Food insecurity:    Worry: Not on file    Inability: Not on file  . Transportation needs:    Medical: Not on file    Non-medical: Not on file  Tobacco Use  . Smoking status: Former Smoker    Types: Cigarettes    Last attempt to quit: 09/12/1962    Years since quitting: 55.2  . Smokeless tobacco: Never Used  Substance and Sexual Activity  . Alcohol use: No  . Drug use: No  . Sexual activity: Not Currently  Lifestyle  . Physical activity:    Days per week: Not on file    Minutes per session: Not on file  . Stress: Not on file  Relationships  . Social connections:    Talks on phone: Not on file    Gets together: Not on file    Attends religious service: Not on file    Active member of club or organization: Not on file    Attends meetings of clubs or organizations: Not on file    Relationship status: Not on file  Other Topics Concern  . Not on file  Social  History Narrative   Patient lives at home with spouse.   Coffee Use: 1-2 cups daily; sodas occasionally   Family History  Problem Relation Age of Onset  . Kidney failure Mother   . Stroke Father    Scheduled Meds: . carbidopa-levodopa  1 tablet Oral QHS  . carbidopa-levodopa  1 tablet Oral BID  .  Carbidopa-Levodopa ER  1 tablet Oral TID  . chlorhexidine gluconate (MEDLINE KIT)  15 mL Mouth Rinse BID   Continuous Infusions: PRN Meds:.acetaminophen, acetaminophen, albuterol, antiseptic oral rinse, glycopyrrolate **OR** glycopyrrolate, LORazepam **OR** LORazepam, morphine CONCENTRATE **OR** morphine CONCENTRATE, ondansetron **OR** ondansetron (ZOFRAN) IV, polyvinyl alcohol Medications Prior to Admission:  Prior to Admission medications   Medication Sig Start Date End Date Taking? Authorizing Provider  acetaminophen (TYLENOL) 500 MG tablet Take 500 mg by mouth daily.    Yes [provider]  carbidopa-levodopa (SINEMET CR) 50-200 MG tablet Take 1/2 tab in morning, 1/2 tab at lunch, and 1 tab at bedtime. 08/28/17  Yes Ronnell Freshwater, NP  escitalopram (LEXAPRO) 10 MG tablet TAKE 1 TABLET BY MOUTH AT BEDTIME 08/28/17  Yes Lavera Guise, MD  lansoprazole (PREVACID) 30 MG capsule Take 30 mg by mouth daily.    Yes [provider]  losartan-hydrochlorothiazide (HYZAAR) 50-12.5 MG tablet Take 1 tablet by mouth daily. 10/11/17  Yes [provider]  Melatonin 5 MG TABS Take 5 mg by mouth at bedtime as needed (sleep).   Yes [provider]  pramipexole (MIRAPEX) 0.25 MG tablet Take 1 tablet (0.25MG) by mouth at lunchtime and 1 tablet (0.25MG) by mouth at bedtime   Yes [provider]   Allergies  Allergen Reactions  . Latex Anaphylaxis   Review of Systems  Unable to perform ROS: Dementia    Physical Exam  Constitutional: She appears lethargic. She appears ill.  Thin and frail in appearance  Cardiovascular: Normal heart sounds. An irregular rhythm  present. Exam reveals decreased pulses.  Pulmonary/Chest: Effort normal. She has decreased breath sounds.  Abdominal: Soft. Normal appearance. Bowel sounds are decreased.  Neurological: She appears lethargic. She displays atrophy.  Skin: Skin is warm and dry. Bruising noted.  Skin tears  Psychiatric: Cognition and memory are impaired. She expresses inappropriate judgment. She is noncommunicative.  Nursing note and vitals reviewed.   Vital Signs: BP (!) 128/56 (BP Location: Left Arm)   Pulse 92   Temp 98.8 F (37.1 C) (Oral)   Resp 16   Ht 5' 3"  (1.6 m)   Wt 64 kg   SpO2 90%   BMI 25.01 kg/m  Pain Scale: PAINAD POSS *See Group Information*: 2-Acceptable,Slightly drowsy, easily aroused Pain Score: 0-No pain   SpO2: SpO2: 90 % O2 Device:SpO2: 90 % O2 Flow Rate: .O2 Flow Rate (L/min): 2 L/min  IO: Intake/output summary:   Intake/Output Summary (Last 24 hours) at 12/13/2017 1443 Last data filed at 12/13/2017 1434 Gross per 24 hour  Intake 120 ml  Output 1400 ml  Net -1280 ml    LBM: Last BM Date: 12/11/17 Baseline Weight: Weight: 62.5 kg Most recent weight: Weight: 64 kg     Palliative Assessment/Data: PPS 20%   Time In: 1300 Time Out: 1405 Time Total: 65 minutes  Greater than 50%  of this time was spent counseling and coordinating care related to the above assessment and plan.  Signed by:  Alda Lea, NP-BC Palliative Medicine Team  Phone: [phone] Fax: [phone] Pager: [phone] Amion: Bjorn Pippin    Please contact Palliative Medicine Team phone at [phone] for questions and concerns.  For individual provider: See Shea Evans

## 2017-12-13 NOTE — Progress Notes (Signed)
New hospice home referral received from [facility] following a Palliative Medicine consult. Hospital care team and family aware that there is currently no bed availability. Patient information faxed to referral.  Flo Shanks RN, BSN, [facility] [place], hospital liaison [phone]

## 2017-12-13 NOTE — Progress Notes (Signed)
[facility] NAME: Kelly Brady    MR#:  [id number]  DATE OF BIRTH:  June 23, 1930  SUBJECTIVE:  CHIEF COMPLAINT:   Chief Complaint  Patient presents with  . Respiratory Distress  Patient seen and evaluated today Lethargic with decreased responsiveness Very poor oral intake  REVIEW OF SYSTEMS:    ROS Could not be obtained secondary to lethargy and confusion  DRUG ALLERGIES:   Allergies  Allergen Reactions  . Latex Anaphylaxis    VITALS:  Blood pressure (!) 128/56, pulse 92, temperature 98.8 F (37.1 C), temperature source Oral, resp. rate 16, height 5\' 3"  (1.6 m), weight 64 kg, SpO2 90 %.  PHYSICAL EXAMINATION:   Physical Exam  GENERAL:  84 y.o.-year-old patient lying in the bed  EYES: Pupils equal, round, reactive to light and accommodation. No scleral icterus. Extraocular muscles intact.  HEENT: Head atraumatic, normocephalic. Oropharynx and nasopharynx clear.  NECK:  Supple, no jugular venous distention. No thyroid enlargement, no tenderness.  LUNGS: Decreased breath sounds bilaterally, basal crepitations heard. No use of accessory muscles of respiration.  CARDIOVASCULAR: S1, S2 normal. No murmurs, rubs, or gallops.  ABDOMEN: Soft, nontender, nondistended. Bowel sounds present. No organomegaly or mass.  EXTREMITIES: No cyanosis, clubbing or edema b/l.    NEUROLOGIC: Not oriented to time, place and person Moves extremities PSYCHIATRIC: could not be assessed SKIN: No obvious rash, lesion, or ulcer.   LABORATORY PANEL:   CBC Recent Labs  Lab 12/13/17 0616  WBC 7.0  HGB 12.1  HCT 34.6*  PLT 258   ------------------------------------------------------------------------------------------------------------------ Chemistries  Recent Labs  Lab 12/07/17 1142  12/11/17 0452  12/13/17 0616  NA 137   < > 133*  --  139  K 3.5   < > 3.0*   < > 3.8  CL 104   < > 103  --  106  CO2 26   < > 21*  --  24  GLUCOSE  98   < > 108*  --  101*  BUN 33*   < > 16  --  9  CREATININE 1.26*   < > 0.64  --  0.71  CALCIUM 9.0   < > 8.4*  --  9.0  MG  --    < > 1.9  --   --   AST 18  --   --   --   --   ALT <5  --   --   --   --   ALKPHOS 52  --   --   --   --   BILITOT 0.5  --   --   --   --    < > = values in this interval not displayed.   ------------------------------------------------------------------------------------------------------------------  Cardiac Enzymes Recent Labs  Lab 12/08/17 0457  TROPONINI <0.03   ------------------------------------------------------------------------------------------------------------------  RADIOLOGY:  Dg Chest Port 1 View  Result Date: 12/12/2017 CLINICAL DATA:  Follow-up pneumonia, former smoker. History of rheumatoid arthritis. EXAM: PORTABLE CHEST 1 VIEW COMPARISON:  Portable chest x-ray of December 10, 2017 FINDINGS: The lungs are reasonably well inflated. Bibasilar densities are present and have become more confluent. The hemidiaphragms are obscured. The heart is top-normal in size. The pulmonary vascularity is mildly prominent centrally. There is calcification in the wall of the aortic arch and descending thoracic aorta. There is thoracolumbar scoliosis. IMPRESSION: Progressive bibasilar densities may reflect atelectasis or pneumonia, and possibly superimposed pleural effusions. Mild central pulmonary vascular prominence without significant  cardiomegaly. Thoracic aortic atherosclerosis. Electronically Signed   By: David  Martinique M.D.   On: 12/12/2017 09:05     ASSESSMENT AND PLAN:  84 year old elderly elderly female patient with history of Parkinson's disease Alzheimer's dementia currently in the ICU for unresponsiveness and respiratory failure  -Status post respiratory failure Off ventilator since three days Monitor oxygen saturation DNR  - Acute Encephalopathy Appears metabolic Family wants comfort measures did not want any aggressive treatment and  intervention  -Adult failure to thrive Refuse to eat Very poor oral intake Patient and family do not want any feeding tube They do not want any aggressive measures They want comfort care  -E. coli urinary tract infection Discontinue IV antibiotics Comfort measures  -Parkinson's disease with dementia Neurology evaluation appreciated Continue oral Sinemet  -Alzheimer's dementia advanced Supportive care  -Social worker consult for hospice facility placement  - prognosis poor in view of multiple comorbidities   All the records are reviewed and case discussed with Care Management/Social Worker. Management plans discussed with the patient, family and they are in agreement.  CODE STATUS: Full code  DVT Prophylaxis: SCDs  TOTAL TIME TAKING CARE OF THIS PATIENT: 32 minutes.   POSSIBLE D/C IN 3 to 4 DAYS, DEPENDING ON CLINICAL CONDITION.  Saundra Shelling M.D on 12/13/2017 at 1:50 PM  Between 7am to 6pm - Pager - [phone]  After 6pm go to [email or web address] - password [email or web address] [facility]  Office  [phone]  CC: Primary care physician; Lavera Guise, MD  Note: This dictation was prepared with Dragon dictation along with smaller phrase technology. Any transcriptional errors that result from this process are unintentional.

## 2017-12-13 NOTE — Progress Notes (Signed)
Subjective: Patient with improved tone and mobility with increase in Sinemet but is refusing to eat.  Has wishes in place that she does not want to have a feed tube.    Objective: Current vital signs: BP (!) 128/56 (BP Location: Left Arm)   Pulse 92   Temp 98.8 F (37.1 C) (Oral)   Resp 16   Ht 5' 3"  (1.6 m)   Wt 64 kg   SpO2 90%   BMI 25.01 kg/m  Vital signs in last 24 hours: Temp:  [97.5 F (36.4 C)-98.8 F (37.1 C)] 98.8 F (37.1 C) (08/28 1003) Pulse Rate:  [76-92] 92 (08/28 1003) Resp:  [16-17] 16 (08/28 1003) BP: (128-171)/(56-94) 128/56 (08/28 1003) SpO2:  [90 %-96 %] 90 % (08/28 1003) Weight:  [64 kg] 64 kg (08/28 0447)  Intake/Output from previous day: 08/27 0701 - 08/28 0700 In: 240 [P.O.:240] Out: 1050 [Urine:1050] Intake/Output this shift: No intake/output data recorded. Nutritional status:  Diet Order            DIET - DYS 1 Room service appropriate? Yes; Fluid consistency: Honey Thick  Diet effective now              Neurologic Exam: Mental Status: Awake but eyes closed.  Follows simple commands.  Speech is minimal.   Cranial Nerves: Pupils reactive.  EOMI.  Mild left facial droop.   Motor: Able to lift both arms against gravity and bring to face.   Sensory: Grossly intact throughout   Lab Results: Basic Metabolic Panel: Recent Labs  Lab 12/08/17 0457 12/09/17 2034 12/10/17 0441 12/11/17 0452 12/11/17 1635 12/13/17 0616  NA 138 135 135 133*  --  139  K 3.2* 3.7 3.3* 3.0* 3.6 3.8  CL 109 106 103 103  --  106  CO2 23 21* 21* 21*  --  24  GLUCOSE 99 103* 108* 108*  --  101*  BUN 29* 15 14 16   --  9  CREATININE 1.04* 0.80 0.75 0.64  --  0.71  CALCIUM 8.4* 8.5* 8.8* 8.4*  --  9.0  MG  --  1.6*  --  1.9  --   --   PHOS  --  1.8*  --  2.2*  --   --     Liver Function Tests: Recent Labs  Lab 12/07/17 1142  AST 18  ALT <5  ALKPHOS 52  BILITOT 0.5  PROT 7.0  ALBUMIN 3.7   No results for input(s): LIPASE, AMYLASE in the last 168  hours. Recent Labs  Lab 12/07/17 1444  AMMONIA 11    CBC: Recent Labs  Lab 12/07/17 1142 12/08/17 0457 12/10/17 0441 12/13/17 0616  WBC 9.2 12.3* 10.1 7.0  NEUTROABS 5.7  --   --   --   HGB 12.2 12.1 12.2 12.1  HCT 35.0 35.0 35.0 34.6*  MCV 87.5 88.9 86.9 87.4  PLT 253 261 210 258    Cardiac Enzymes: Recent Labs  Lab 12/07/17 1142 12/07/17 1906 12/08/17 0102 12/08/17 0457  TROPONINI <0.03 <0.03 <0.03 <0.03    Lipid Panel: No results for input(s): CHOL, TRIG, HDL, CHOLHDL, VLDL, LDLCALC in the last 168 hours.  CBG: Recent Labs  Lab 12/07/17 0865  GLUCAP 88*    Microbiology: Results for orders placed or performed during the hospital encounter of 12/07/17  Urine culture     Status: Abnormal   Collection Time: 12/07/17 11:42 AM  Result Value Ref Range Status   Specimen Description   Final  URINE, RANDOM Performed at [facility], [address] [place], [place] [address]    Special Requests   Final    NONE Performed at [facility], [address] [place], [place] [address]    Culture >=100,000 COLONIES/mL ESCHERICHIA COLI (A)  Final   Report Status 12/09/2017 FINAL  Final   Organism ID, Bacteria ESCHERICHIA COLI (A)  Final      Susceptibility   Escherichia coli - MIC*    AMPICILLIN 8 SENSITIVE Sensitive     CEFAZOLIN <=4 SENSITIVE Sensitive     CEFTRIAXONE <=1 SENSITIVE Sensitive     CIPROFLOXACIN <=0.25 SENSITIVE Sensitive     GENTAMICIN <=1 SENSITIVE Sensitive     IMIPENEM <=0.25 SENSITIVE Sensitive     NITROFURANTOIN <=16 SENSITIVE Sensitive     TRIMETH/SULFA <=20 SENSITIVE Sensitive     AMPICILLIN/SULBACTAM 4 SENSITIVE Sensitive     PIP/TAZO <=4 SENSITIVE Sensitive     Extended ESBL NEGATIVE Sensitive     * >=100,000 COLONIES/mL ESCHERICHIA COLI  MRSA PCR Screening     Status: None   Collection Time: 12/07/17  3:27 PM  Result Value Ref Range Status   MRSA by PCR NEGATIVE NEGATIVE Final    Comment:        The  GeneXpert MRSA Assay (FDA approved for NASAL specimens only), is one component of a comprehensive MRSA colonization surveillance program. It is not intended to diagnose MRSA infection nor to guide or monitor treatment for MRSA infections. Performed at [facility], [address] [place], [place] [address]   Culture, blood (routine x 2)     Status: None   Collection Time: 12/07/17  4:00 PM  Result Value Ref Range Status   Specimen Description BLOOD BLOOD LEFT WRIST  Final   Special Requests   Final    BOTTLES DRAWN AEROBIC AND ANAEROBIC Blood Culture results may not be optimal due to an inadequate volume of blood received in culture bottles   Culture   Final    NO GROWTH 5 DAYS Performed at [facility], [address] [place], [place] [address]    Report Status 12/12/2017 FINAL  Final  Culture, blood (routine x 2)     Status: Abnormal   Collection Time: 12/07/17  4:22 PM  Result Value Ref Range Status   Specimen Description   Final    BLOOD LEFT HAND Performed at [facility], [place] [address] [place], [place] [address]    Special Requests   Final    BOTTLES DRAWN AEROBIC AND ANAEROBIC Blood Culture results may not be optimal due to an inadequate volume of blood received in culture bottles Performed at [facility], [address] [place], [place] [address]    Culture  Setup Time   Final    GRAM POSITIVE COCCI ANAEROBIC BOTTLE ONLY CRITICAL RESULT CALLED TO, READ BACK BY AND VERIFIED WITH: MATT MCBANE @2311  12/08/17 AKT    Culture (A)  Final    STAPHYLOCOCCUS SPECIES (COAGULASE NEGATIVE) THE SIGNIFICANCE OF ISOLATING THIS ORGANISM FROM A SINGLE SET OF BLOOD CULTURES WHEN MULTIPLE SETS ARE DRAWN IS UNCERTAIN. PLEASE NOTIFY THE MICROBIOLOGY DEPARTMENT WITHIN ONE WEEK IF SPECIATION AND SENSITIVITIES ARE REQUIRED. Performed at [facility], [place] [address] [place], [place] [address]    Report Status 12/11/2017 FINAL  Final  Blood  Culture ID Panel (Reflexed)     Status: Abnormal   Collection Time: 12/07/17  4:22 PM  Result Value Ref Range  Status   Enterococcus species NOT DETECTED NOT DETECTED Final   Listeria monocytogenes NOT DETECTED NOT DETECTED Final   Staphylococcus species DETECTED (A) NOT DETECTED Final    Comment: Methicillin (oxacillin) susceptible coagulase negative staphylococcus. Possible blood culture contaminant (unless isolated from more than one blood culture draw or clinical case suggests pathogenicity). No antibiotic treatment is indicated for blood  culture contaminants. CRITICAL RESULT CALLED TO, READ BACK BY AND VERIFIED WITH: MATT MCBANE @2311  12/08/17 AKT    Staphylococcus aureus NOT DETECTED NOT DETECTED Final   Methicillin resistance NOT DETECTED NOT DETECTED Final   Streptococcus species NOT DETECTED NOT DETECTED Final   Streptococcus agalactiae NOT DETECTED NOT DETECTED Final   Streptococcus pneumoniae NOT DETECTED NOT DETECTED Final   Streptococcus pyogenes NOT DETECTED NOT DETECTED Final   Acinetobacter baumannii NOT DETECTED NOT DETECTED Final   Enterobacteriaceae species NOT DETECTED NOT DETECTED Final   Enterobacter cloacae complex NOT DETECTED NOT DETECTED Final   Escherichia coli NOT DETECTED NOT DETECTED Final   Klebsiella oxytoca NOT DETECTED NOT DETECTED Final   Klebsiella pneumoniae NOT DETECTED NOT DETECTED Final   Proteus species NOT DETECTED NOT DETECTED Final   Serratia marcescens NOT DETECTED NOT DETECTED Final   Haemophilus influenzae NOT DETECTED NOT DETECTED Final   Neisseria meningitidis NOT DETECTED NOT DETECTED Final   Pseudomonas aeruginosa NOT DETECTED NOT DETECTED Final   Candida albicans NOT DETECTED NOT DETECTED Final   Candida glabrata NOT DETECTED NOT DETECTED Final   Candida krusei NOT DETECTED NOT DETECTED Final   Candida parapsilosis NOT DETECTED NOT DETECTED Final   Candida tropicalis NOT DETECTED NOT DETECTED Final    Comment: Performed at [facility], [address] [place], [place] [address]    Coagulation Studies: No results for input(s): LABPROT, INR in the last 72 hours.  Imaging: Dg Chest Port 1 View  Result Date: 12/12/2017 CLINICAL DATA:  Follow-up pneumonia, former smoker. History of rheumatoid arthritis. EXAM: PORTABLE CHEST 1 VIEW COMPARISON:  Portable chest x-ray of December 10, 2017 FINDINGS: The lungs are reasonably well inflated. Bibasilar densities are present and have become more confluent. The hemidiaphragms are obscured. The heart is top-normal in size. The pulmonary vascularity is mildly prominent centrally. There is calcification in the wall of the aortic arch and descending thoracic aorta. There is thoracolumbar scoliosis. IMPRESSION: Progressive bibasilar densities may reflect atelectasis or pneumonia, and possibly superimposed pleural effusions. Mild central pulmonary vascular prominence without significant cardiomegaly. Thoracic aortic atherosclerosis. Electronically Signed   By: David  Martinique M.D.   On: 12/12/2017 09:05    Medications:  I have reviewed the patient's current medications. Scheduled: . carbidopa-levodopa  1 tablet Oral QHS  . carbidopa-levodopa  1 tablet Oral BID  . Carbidopa-Levodopa ER  1 tablet Oral TID  . chlorhexidine gluconate (MEDLINE KIT)  15 mL Mouth Rinse BID    Assessment/Plan: Movement improved.  Patient does not want to eat or open eyes although when asked able to do both.  Spoke with family at length.  They feel patient has given up and they feel palliative care is the most appropriate.    Recommendations: 1.  Would continue Sinemet as needed for comfort.   2. No further neurologic intervention is recommended at this time.  If further questions arise, please call or page at that time.  Thank you for allowing neurology to participate in the care of this patient.    LOS: 6 days   Alexis Goodell, MD Neurology [phone] 12/13/2017  12:08 PM

## 2017-12-13 NOTE — Progress Notes (Signed)
OT Cancellation Note  Patient Details Name: Kelly Brady MRN: [id number] DOB: 12-04-1930   Cancelled Treatment:    Reason Eval/Treat Not Completed: Other (comment). Per chart review, pt now being transitioned to comfort care. OT consult cancelled.   Jeni Salles, MPH, MS, OTR/L ascom [phone] 12/13/17, 2:24 PM

## 2017-12-13 NOTE — Progress Notes (Signed)
OT Cancellation Note  Patient Details Name: Kelly Brady MRN: [id number] DOB: 22-Mar-1931   Cancelled Treatment:    Reason Eval/Treat Not Completed: Other (comment). Spoke with RN, pt continues to be lethargic. Neurology in with pt now. Per RN, pt inappropriate at this time for OT evaluation. Will continue to follow and re-attempt OT evaluation at later date/time as pt is medically appropriate and able to meaningfully participate.  Jeni Salles, MPH, MS, OTR/L ascom [phone] 12/13/17, 9:52 AM

## 2017-12-13 NOTE — Progress Notes (Addendum)
PT Cancellation Note  Patient Details Name: SHERRIAN NUNNELLEY MRN: [id number] DOB: 1931-03-03   Cancelled Treatment:    Reason Eval/Treat Not Completed: Other (comment).  Per discussion with pt's nurse, pt lethargic and not appropriate for physical therapy at this time.  Discussion of change in POC/goals for pt today.  Will monitor pt's status and initiate therapy if determined to be appropriate.  Leitha Bleak, PT 12/13/17, 11:10 AM [phone]  Addendum:  PT consult cancelled and pt transitioned to comfort care.  Leitha Bleak, PT 12/13/17, 11:15 AM [phone]

## 2017-12-13 NOTE — Plan of Care (Signed)
  Problem: Education: Goal: Knowledge of General Education information will improve Description Including pain rating scale, medication(s)/side effects and non-pharmacologic comfort measures Outcome: Not Progressing Note:  Patient with history of dementia. Hospice bed awaiting approval / transfer. Will continue to monitor. Kelly Brady [facility]

## 2017-12-14 DIAGNOSIS — Z515 Encounter for palliative care: Secondary | ICD-10-CM

## 2017-12-14 DIAGNOSIS — R627 Adult failure to thrive: Secondary | ICD-10-CM

## 2017-12-14 NOTE — Progress Notes (Signed)
Daily Progress Note   Patient Name: Kelly Brady       Date: 12/14/2017 DOB: 01/21/1931  Age: 84 y.o. MRN#: [id number] Attending Physician: Saundra Shelling, MD Primary Care Physician: Lavera Guise, MD Admit Date: 12/07/2017  Reason for Consultation/Follow-up: Non pain symptom management, Pain control, Psychosocial/spiritual support and Terminal Care  Subjective: Patient resting, appears comfortable, husband and family friend at bedside. No concerns from RN.   Length of Stay: 7  Current Medications: Scheduled Meds:  . carbidopa-levodopa  1 tablet Oral TID  . carbidopa-levodopa  2 tablet Oral BID  . carbidopa-levodopa  2 tablet Oral QHS  . chlorhexidine gluconate (MEDLINE KIT)  15 mL Mouth Rinse BID    Continuous Infusions:   PRN Meds: acetaminophen, acetaminophen, albuterol, antiseptic oral rinse, Glycerin-Hypromellose-PEG 400, glycopyrrolate **OR** glycopyrrolate, LORazepam **OR** LORazepam, morphine CONCENTRATE **OR** morphine CONCENTRATE, ondansetron **OR** ondansetron (ZOFRAN) IV  Physical Exam  Constitutional: She appears unresponsive. Thin and frail in appearance  Cardiovascular: An irregular rhythm present. Exam reveals decreased pulses.  Pulmonary/Chest: Effort normal. She has decreased breath sounds.  Abdominal: Soft. Normal appearance. Neurological: She appears unresponsive. She displays atrophy.  Skin: Skin is warm and dry. Bruising noted.  Skin tears  Psychiatric: Cognition and memory are impaired. She is noncommunicative.  Nursing note and vitals reviewed.     Vital Signs: BP (!) 128/56 (BP Location: Left Arm)   Pulse 92   Temp 98.8 F (37.1 C) (Oral)   Resp 16   Ht 5' 3"  (1.6 m)   Wt 64 kg   SpO2 90%   BMI 25.01 kg/m  SpO2: SpO2: 90 % O2 Device: O2 Device: Room  Air O2 Flow Rate: O2 Flow Rate (L/min): 2 L/min  Intake/output summary:   Intake/Output Summary (Last 24 hours) at 12/14/2017 1200 Last data filed at 12/14/2017 6144 Gross per 24 hour  Intake -  Output 2000 ml  Net -2000 ml   LBM: Last BM Date: 12/11/17 Baseline Weight: Weight: 62.5 kg Most recent weight: Weight: 64 kg       Palliative Assessment/Data: PPS 10%    Flowsheet Rows     Most Recent Value  Intake Tab  Referral Department  Hospitalist  Unit at Time of Referral  Cardiac/Telemetry Unit  Palliative Care Primary Diagnosis  Neurology  Date Notified  12/07/17  Palliative Care Type  New Palliative care  Reason for referral  Clarify Goals of Care  Date of Admission  12/07/17  Date first seen by Palliative Care  12/13/17  # of days Palliative referral response time  6 Day(s)  # of days IP prior to Palliative referral  0  Clinical Assessment  Psychosocial & Spiritual Assessment  Palliative Care Outcomes      Patient Active Problem List   Diagnosis Date Noted  . Respiratory failure ([place]) 12/07/2017  . 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 Arthritis, degenerative 10/01/2013  . Atrophic vaginitis 10/01/2013  . BP (high blood pressure) 10/01/2013  . Cannot sleep 10/01/2013  . Clinical depression 10/01/2013  . DD (diverticular disease) 10/01/2013  . Headache, migraine 10/01/2013  . History of colon polyps 10/01/2013  . HLD (hyperlipidemia) 10/01/2013  . OP (osteoporosis) 10/01/2013  . Parkinson's disease ([place])  10/01/2013  . Dementia without behavioral disturbance 01/11/2013    Palliative Care Assessment & Plan   HPI: 84 y.o. female admitted on 12/07/2017 from home after collapsing and becoming unresponsive. She has a past medical history significant for Parkinson's disease, dementia, chronic ataxia, and rheumatoid arthritis. Patient lives at home with husband and was brought in after collapsing to the floor. On ED presentation patient required emergent intubation  for airway protection and found to be severely obtunded. During her ED course she was found to be dehydrated, and [place] suspicious for UTI. UA showed large leukocytes, positive nitrates, and 30 Protein. Creatinine 1.2 EKG showed ST depression laterally with T wave inversion and RBBB. Family was present at bedside. Since admission patient has been extubated. She remains lethargic with minimum response. She has been seen by Neurology. Palliative Medicine team consulted for goals of care.   Assessment: Patient appears comfortable, does not respond to me. Family and nursing deny concerns - also feel she is comfortable. Family reports she woke up last night and told her son a joke. They are thankful for some interactions with her. They share about her and her sense of humor. Discussed waiting on hospice bed. They still want to proceed with hospice home with bed is available.   Recommendations/Plan:  DNR  Full comfort measures while hospitalized.  Family is hopeful patient will get a bed at the local hospice home facility, however they are aware that patient will remain in the hospital and potentially be transferred to 1C for end-of-life care.  They are aware that the hospice home currently has a waiting list.  Tylenol PRN for fever  Robinul PRN for excessive secretions  Ativan PRN for anxiety/agitation  Morphine PRN for pain and shortness of breath  Zofran PRN for nausea/vomiting  Palliative medicine team will continue to support patient, family, medical team during hospitalization  Goals of Care and Additional Recommendations:  Limitations on Scope of Treatment: Full Comfort Care  Code Status:  DNR  Prognosis:   < 2 weeks  Discharge Planning:  Hospice facility  Care plan was discussed with RN and family  Thank you for allowing the Palliative Medicine Team to assist in the care of this patient.   Total Time 15 minutes Prolonged Time Billed  no       Greater than 50%  of this  time was spent counseling and coordinating care related to the above assessment and plan.  Juel Burrow, DNP, [facility] Palliative Medicine Team Team Phone # [phone]  Pager [phone]

## 2017-12-14 NOTE — Care Management Important Message (Signed)
Copy of signed IM left with patient in room.  

## 2017-12-14 NOTE — Progress Notes (Signed)
[place] at [place] NAME: Kelly Brady    MR#:  [id number]  DATE OF BIRTH:  Mar 03, 1931  SUBJECTIVE:  CHIEF COMPLAINT:   Chief Complaint  Patient presents with  . Respiratory Distress  Patient seen and evaluated today No new overnight events Patient not in pain Comfort measures on board Very poor oral intake  REVIEW OF SYSTEMS:    ROS Could not be obtained secondary to lethargy and confusion  DRUG ALLERGIES:   Allergies  Allergen Reactions  . Latex Anaphylaxis    VITALS:  Blood pressure (!) 128/56, pulse 92, temperature 98.8 F (37.1 C), temperature source Oral, resp. rate 16, height 5\' 3"  (1.6 m), weight 64 kg, SpO2 90 %.  PHYSICAL EXAMINATION:   Physical Exam  GENERAL:  84 y.o.-year-old patient lying in the bed  EYES: Pupils equal, round, reactive to light and accommodation. No scleral icterus. Extraocular muscles intact.  HEENT: Head atraumatic, normocephalic. Oropharynx and nasopharynx clear.  NECK:  Supple, no jugular venous distention. No thyroid enlargement, no tenderness.  LUNGS: Decreased breath sounds bilaterally, basal crepitations heard. No use of accessory muscles of respiration.  CARDIOVASCULAR: S1, S2 normal. No murmurs, rubs, or gallops.  ABDOMEN: Soft, nontender, nondistended. Bowel sounds present. No organomegaly or mass.  EXTREMITIES: No cyanosis, clubbing or edema b/l.    NEUROLOGIC: Not oriented to time, place and person Moves extremities PSYCHIATRIC: could not be assessed SKIN: No obvious rash, lesion, or ulcer.   LABORATORY PANEL:   CBC Recent Labs  Lab 12/13/17 0616  WBC 7.0  HGB 12.1  HCT 34.6*  PLT 258   ------------------------------------------------------------------------------------------------------------------ Chemistries  Recent Labs  Lab 12/11/17 0452  12/13/17 0616  NA 133*  --  139  K 3.0*   < > 3.8  CL 103  --  106  CO2 21*  --  24  GLUCOSE 108*  --  101*  BUN 16   --  9  CREATININE 0.64  --  0.71  CALCIUM 8.4*  --  9.0  MG 1.9  --   --    < > = values in this interval not displayed.   ------------------------------------------------------------------------------------------------------------------  Cardiac Enzymes Recent Labs  Lab 12/08/17 0457  TROPONINI <0.03   ------------------------------------------------------------------------------------------------------------------  RADIOLOGY:  No results found.   ASSESSMENT AND PLAN:  84 year old elderly female patient with history of Parkinson's disease Alzheimer's dementia currently in the ICU for unresponsiveness and respiratory failure  -Status post respiratory failure Off ventilator since three days Monitor oxygen saturation DNR  - Acute Encephalopathy/altered mental status Appears metabolic Family wants comfort measures did not want any aggressive treatment and intervention  -Adult failure to thrive Refused to eat Very poor oral intake Patient and family do not want any feeding tube They do not want any aggressive measures They want comfort care Comfort measures on board  -E. coli urinary tract infection Discontinue IV antibiotics Comfort measures only  -Parkinson's disease with advanced dementia Neurology evaluation appreciated Continue oral Sinemet  -Alzheimer's dementia advanced Supportive care  -Await bed placement at hospice facility  - prognosis poor in view of multiple comorbidities   All the records are reviewed and case discussed with Care Management/Social Worker. Management plans discussed with the patient, family and they are in agreement.  CODE STATUS: Full code  DVT Prophylaxis: SCDs  TOTAL TIME TAKING CARE OF THIS PATIENT: 22 minutes.   POSSIBLE D/C IN 3 to 4 DAYS, DEPENDING ON CLINICAL CONDITION.  Saundra Shelling M.D on  12/14/2017 at 1:07 PM  Between 7am to 6pm - Pager - [phone]  After 6pm go to [email or web address] - password [email or web address]  [facility]  Office  [phone]  CC: Primary care physician; Lavera Guise, MD  Note: This dictation was prepared with Dragon dictation along with smaller phrase technology. Any transcriptional errors that result from this process are unintentional.

## 2017-12-14 NOTE — Clinical Social Work Note (Signed)
CSW contacted [facility], they still do not have a bed an patient is on the wait list.  CSW will notify physician and family once CSW was informed that bed is available.  Jones Broom. Andrews, MSW, [place]  12/14/2017 10:20 AM

## 2017-12-15 ENCOUNTER — Telehealth: Payer: Self-pay

## 2017-12-15 MED ORDER — MORPHINE SULFATE (CONCENTRATE) 10 MG/0.5ML PO SOLN: 5.0000 mg | ORAL | 0 refills | Status: AC | PRN

## 2017-12-15 MED ORDER — LORAZEPAM 2 MG/ML PO CONC: 0.6000 mg | ORAL | 0 refills | Status: AC | PRN

## 2017-12-15 MED ORDER — CARBIDOPA-LEVODOPA 25-100 MG PO TABS: 1.0000 | ORAL_TABLET | Freq: Three times a day (TID) | ORAL | 0 refills | Status: AC

## 2017-12-15 NOTE — Progress Notes (Signed)
Follow up on new referral for in patient hospice.  We have a bed available today for patient to transport to hospice home.  No family present.  Called and spoke with the patient's daughter Cindy Martinique.  She will plan to come to the hospice home this morning at 1100 to complete admission paperwork.  Report received from [facility] and given to team at hospice home.  Transport set by this Therapist, sports for 1200.  EMS confirmed pick up at that time.  Patient to keep IV line/saline locks in.  No other equipment.  RN to dose patient with Morphine 5mg  prior to transport.  Discharge summary faxed to referral intake.  Dimas Aguas, RN

## 2017-12-15 NOTE — Progress Notes (Signed)
Nutrition Brief Note  Chart reviewed. Pt now transitioning to comfort care.  No further nutrition interventions warranted at this time.  Please re-consult as needed.   Ashawn Rinehart A. Johnnie Moten, RD, LDN, CDE Pager: [phone] After hours Pager: [phone]  

## 2017-12-15 NOTE — Plan of Care (Signed)
  Problem: Education: Goal: Knowledge of General Education information will improve Description: Including pain rating scale, medication(s)/side effects and non-pharmacologic comfort measures Outcome: Progressing   Problem: Health Behavior/Discharge Planning: Goal: Ability to manage health-related needs will improve Outcome: Progressing   Problem: Clinical Measurements: Goal: Will remain free from infection Outcome: Progressing Goal: Diagnostic test results will improve Outcome: Progressing Goal: Cardiovascular complication will be avoided Outcome: Progressing   Problem: Activity: Goal: Risk for activity intolerance will decrease Outcome: Progressing   Problem: Nutrition: Goal: Adequate nutrition will be maintained Outcome: Progressing   Problem: Coping: Goal: Level of anxiety will decrease Outcome: Progressing   Problem: Elimination: Goal: Will not experience complications related to bowel motility Outcome: Progressing Goal: Will not experience complications related to urinary retention Outcome: Progressing   Problem: Pain Managment: Goal: General experience of comfort will improve Outcome: Progressing   Problem: Safety: Goal: Ability to remain free from injury will improve Outcome: Progressing   Problem: Skin Integrity: Goal: Risk for impaired skin integrity will decrease Outcome: Progressing   

## 2017-12-15 NOTE — Progress Notes (Signed)
Follow up on patient who remains on the hospice home waiting list.  Still no beds available today.  Will continue to follow.  Dimas Aguas, RN [phone]

## 2017-12-15 NOTE — Progress Notes (Signed)
Report called to [facility]. Facility requests for all PIVs to be left.  Facility to arrange transport.

## 2017-12-15 NOTE — Clinical Social Work Note (Signed)
Patient to be d/c'ed today to [facility] hospice facility. Patient and family agreeable to plans will transport via ems hospice RN to call report.  Family aware that patient is discharging today.  Evette Cristal, MSW, [place]

## 2017-12-15 NOTE — Telephone Encounter (Signed)
PT WAS ADMITTED INTO HOSPICE IN [facility] ON 12/15/2017. A FAX WAS SENT OVER TO NOTIFY us.

## 2017-12-15 NOTE — Progress Notes (Signed)
Pt discharged via EMS.  Pre-medicated with Morphine 5mg  SL.  No questions or concerns at this time.

## 2017-12-15 NOTE — Discharge Summary (Signed)
Kelly Brady at [place] NAME: Kelly Brady    MR#:  [id number]  DATE OF BIRTH:  April 28, 1930  DATE OF ADMISSION:  12/07/2017 ADMITTING PHYSICIAN: Gorden Harms, MD  DATE OF DISCHARGE: 12/15/2017  PRIMARY CARE PHYSICIAN: Lavera Guise, MD   ADMISSION DIAGNOSIS:  Respiratory distress [R06.03] Altered mental status, unspecified altered mental status type [R41.82] Hypoxic respiratory failure Urinary tract infection Dehydration Advanced dementia Chronic Parkinson's disease DISCHARGE DIAGNOSIS:  Active Problems:   Respiratory failure (HCC)   FTT (failure to thrive) in adult   Comfort measures only status   Palliative care by specialist E. coli urinary tract infection Dehydration Advanced dementia Parkinson's disease  SECONDARY DIAGNOSIS:   Past Medical History:  Diagnosis Date  . Closed fracture of unspecified part of vertebral column without mention of spinal cord injury   . Dementia without behavioral disturbance 01/11/2013  . Disturbance of skin sensation   . Lumbago   . Paralysis agitans ([place])   . Rheumatoid arthritis(714.0)   . Unspecified vitamin D deficiency      ADMITTING HISTORY : Kelly Brady  is a 84 y.o. female with a known history of Parkinson's disease with dementia/chronic ataxia, was being assisted into her home by family whereupon she collapsed to the floor, became acutely unresponsive, brought to the emergency room for further evaluation/care, patient required emergent intubation for airway protection for acute severe obtundation, ER work-up noted for acute dehydration, urinalysis suspicious for UTI, creatinine 1.2, EKG noted for ST depression laterally with T wave inversions/right bundle branch block, patient evaluated in the ER, husband and multiple family members present, patient is currently comatose on ventilator, patient is now been admitted for acute coma due to acute hypoxic respiratory failure, probable  acute urinary tract infection, dehydration.   HOSPITAL COURSE:  Shunt was initially admitted to ICU on ventilator for respiratory failure.  She was evaluated by intensivist and ventilator was managed with the help of intensivist attending.  Patient received IV Rocephin antibiotic for urinary tract infection.  Her serial troponins were negative.  Work-up for syncope was negative.  Patient was encephalopathic during her stay in the hospital.  She was extubated and put on oxygen via nasal cannula.  Urine culture grew E. Coli.  Patient's family did not want any CPR and intubation if the need arises and patient was made DNR.  Blood cultures closed coagulase-negative staph aureus.  Patient was moved to the regular telemetry for.  Palliative care consultation was done and goals of care were discussed.  Family does not want any aggressive measures or interventions which include IV antibiotics IV fluids and any procedures.  Patient had very poor oral intake during the hospitalization.  Patient and family do not want any feeding tube.  Patient continued to deteriorate and was encephalopathic.  Patient has advanced dementia and Parkinson's disease neurology evaluation was done in the hospitalization.  Patient's family wanted only comfort care and social worker consult was done for hospice facility placement.  Comfort measures were started in the hospital.  Patient has a bed at hospice facility and will be discharged today to hospice facility.  CONSULTS OBTAINED:  Treatment Team:  Bradly Bienenstock, NP Leotis Pain, MD  DRUG ALLERGIES:   Allergies  Allergen Reactions  . Latex Anaphylaxis    DISCHARGE MEDICATIONS:   Allergies as of 12/15/2017      Reactions   Latex Anaphylaxis      Medication List    STOP taking these  medications   acetaminophen 500 MG tablet Commonly known as:  TYLENOL   carbidopa-levodopa 50-200 MG tablet Commonly known as:  SINEMET CR Replaced by:  carbidopa-levodopa  25-100 MG tablet   escitalopram 10 MG tablet Commonly known as:  LEXAPRO   lansoprazole 30 MG capsule Commonly known as:  PREVACID   losartan-hydrochlorothiazide 50-12.5 MG tablet Commonly known as:  HYZAAR   Melatonin 5 MG Tabs   pramipexole 0.25 MG tablet Commonly known as:  MIRAPEX     TAKE these medications   carbidopa-levodopa 25-100 MG tablet Commonly known as:  SINEMET IR Take 1 tablet by mouth 3 (three) times daily for 15 days. Replaces:  carbidopa-levodopa 50-200 MG tablet   LORazepam 2 MG/ML concentrated solution Commonly known as:  ATIVAN Place 0.3 mLs (0.6 mg total) under the tongue every 4 (four) hours as needed for anxiety.   morphine CONCENTRATE 10 MG/0.5ML Soln concentrated solution Take 0.25 mLs (5 mg total) by mouth every 2 (two) hours as needed for moderate pain (or dyspnea).       Today  Patient seen today Is lethargic and confused Has weakness Not oriented to time place and person Poor oral intake  VITAL SIGNS:  Blood pressure (!) 160/75, pulse (!) 114, temperature 99.2 F (37.3 C), temperature source Axillary, resp. rate (!) 22, height 5\' 3"  (1.6 m), weight 64 kg, SpO2 93 %.  I/O:    Intake/Output Summary (Last 24 hours) at 12/15/2017 1059 Last data filed at 12/15/2017 0500 Gross per 24 hour  Intake 0 ml  Output 800 ml  Net -800 ml    PHYSICAL EXAMINATION:  Physical Exam  GENERAL:  84 y.o.-year-old patient lying in the bed LUNGS: Normal breath sounds bilaterally, no wheezing, rales,rhonchi or crepitation. No use of accessory muscles of respiration.  CARDIOVASCULAR: S1, S2 normal. No murmurs, rubs, or gallops.  ABDOMEN: Soft, non-tender, non-distended. Bowel sounds present. No organomegaly or mass.  NEUROLOGIC: Ataxic and confused PSYCHIATRIC: Not oriented to time place and person SKIN: No obvious rash, lesion, or ulcer.   DATA REVIEW:   CBC Recent Labs  Lab 12/13/17 0616  WBC 7.0  HGB 12.1  HCT 34.6*  PLT 258     Chemistries  Recent Labs  Lab 12/11/17 0452  12/13/17 0616  NA 133*  --  139  K 3.0*   < > 3.8  CL 103  --  106  CO2 21*  --  24  GLUCOSE 108*  --  101*  BUN 16  --  9  CREATININE 0.64  --  0.71  CALCIUM 8.4*  --  9.0  MG 1.9  --   --    < > = values in this interval not displayed.    Cardiac Enzymes No results for input(s): TROPONINI in the last 168 hours.  Microbiology Results  Results for orders placed or performed during the hospital encounter of 12/07/17  Urine culture     Status: Abnormal   Collection Time: 12/07/17 11:42 AM  Result Value Ref Range Status   Specimen Description   Final    URINE, RANDOM Performed at [facility], [address] [place], [place] [address]    Special Requests   Final    NONE Performed at [facility], [address] [place], [place] [address]    Culture >=100,000 COLONIES/mL ESCHERICHIA COLI (A)  Final   Report Status 12/09/2017 FINAL  Final   Organism ID, Bacteria ESCHERICHIA COLI (A)  Final  Susceptibility   Escherichia coli - MIC*    AMPICILLIN 8 SENSITIVE Sensitive     CEFAZOLIN <=4 SENSITIVE Sensitive     CEFTRIAXONE <=1 SENSITIVE Sensitive     CIPROFLOXACIN <=0.25 SENSITIVE Sensitive     GENTAMICIN <=1 SENSITIVE Sensitive     IMIPENEM <=0.25 SENSITIVE Sensitive     NITROFURANTOIN <=16 SENSITIVE Sensitive     TRIMETH/SULFA <=20 SENSITIVE Sensitive     AMPICILLIN/SULBACTAM 4 SENSITIVE Sensitive     PIP/TAZO <=4 SENSITIVE Sensitive     Extended ESBL NEGATIVE Sensitive     * >=100,000 COLONIES/mL ESCHERICHIA COLI  MRSA PCR Screening     Status: None   Collection Time: 12/07/17  3:27 PM  Result Value Ref Range Status   MRSA by PCR NEGATIVE NEGATIVE Final    Comment:        The GeneXpert MRSA Assay (FDA approved for NASAL specimens only), is one component of a comprehensive MRSA colonization surveillance program. It is not intended to diagnose MRSA infection nor to guide or monitor  treatment for MRSA infections. Performed at [facility], [address] [place], [place] [address]   Culture, blood (routine x 2)     Status: None   Collection Time: 12/07/17  4:00 PM  Result Value Ref Range Status   Specimen Description BLOOD BLOOD LEFT WRIST  Final   Special Requests   Final    BOTTLES DRAWN AEROBIC AND ANAEROBIC Blood Culture results may not be optimal due to an inadequate volume of blood received in culture bottles   Culture   Final    NO GROWTH 5 DAYS Performed at [facility], [address] [place], [place] [address]    Report Status 12/12/2017 FINAL  Final  Culture, blood (routine x 2)     Status: Abnormal   Collection Time: 12/07/17  4:22 PM  Result Value Ref Range Status   Specimen Description   Final    BLOOD LEFT HAND Performed at [facility], [place] [address], [place], [place] [address]    Special Requests   Final    BOTTLES DRAWN AEROBIC AND ANAEROBIC Blood Culture results may not be optimal due to an inadequate volume of blood received in culture bottles Performed at [facility], [address] [place], [place] [address]    Culture  Setup Time   Final    GRAM POSITIVE COCCI ANAEROBIC BOTTLE ONLY CRITICAL RESULT CALLED TO, READ BACK BY AND VERIFIED WITH: MATT MCBANE @2311  12/08/17 AKT    Culture (A)  Final    STAPHYLOCOCCUS SPECIES (COAGULASE NEGATIVE) THE SIGNIFICANCE OF ISOLATING THIS ORGANISM FROM A SINGLE SET OF BLOOD CULTURES WHEN MULTIPLE SETS ARE DRAWN IS UNCERTAIN. PLEASE NOTIFY THE MICROBIOLOGY DEPARTMENT WITHIN ONE WEEK IF SPECIATION AND SENSITIVITIES ARE REQUIRED. Performed at [facility], [place] [address] [place], [place] [address]    Report Status 12/11/2017 FINAL  Final  Blood Culture ID Panel (Reflexed)     Status: Abnormal   Collection Time: 12/07/17  4:22 PM  Result Value Ref Range Status   Enterococcus species NOT DETECTED NOT DETECTED Final   Listeria monocytogenes NOT DETECTED NOT  DETECTED Final   Staphylococcus species DETECTED (A) NOT DETECTED Final    Comment: Methicillin (oxacillin) susceptible coagulase negative staphylococcus. Possible blood culture contaminant (unless isolated from more than one blood culture draw or clinical case suggests pathogenicity). No antibiotic treatment is indicated for blood  culture contaminants. CRITICAL RESULT CALLED TO, READ BACK  BY AND VERIFIED WITH: MATT MCBANE @2311  12/08/17 AKT    Staphylococcus aureus NOT DETECTED NOT DETECTED Final   Methicillin resistance NOT DETECTED NOT DETECTED Final   Streptococcus species NOT DETECTED NOT DETECTED Final   Streptococcus agalactiae NOT DETECTED NOT DETECTED Final   Streptococcus pneumoniae NOT DETECTED NOT DETECTED Final   Streptococcus pyogenes NOT DETECTED NOT DETECTED Final   Acinetobacter baumannii NOT DETECTED NOT DETECTED Final   Enterobacteriaceae species NOT DETECTED NOT DETECTED Final   Enterobacter cloacae complex NOT DETECTED NOT DETECTED Final   Escherichia coli NOT DETECTED NOT DETECTED Final   Klebsiella oxytoca NOT DETECTED NOT DETECTED Final   Klebsiella pneumoniae NOT DETECTED NOT DETECTED Final   Proteus species NOT DETECTED NOT DETECTED Final   Serratia marcescens NOT DETECTED NOT DETECTED Final   Haemophilus influenzae NOT DETECTED NOT DETECTED Final   Neisseria meningitidis NOT DETECTED NOT DETECTED Final   Pseudomonas aeruginosa NOT DETECTED NOT DETECTED Final   Candida albicans NOT DETECTED NOT DETECTED Final   Candida glabrata NOT DETECTED NOT DETECTED Final   Candida krusei NOT DETECTED NOT DETECTED Final   Candida parapsilosis NOT DETECTED NOT DETECTED Final   Candida tropicalis NOT DETECTED NOT DETECTED Final    Comment: Performed at [facility], [address] [place], [place] [address]    RADIOLOGY:  No results found.  Follow up with PCP in 1 week.  Management plans discussed with the patient, family and they are in agreement.  CODE  STATUS: DNR    Code Status Orders  (From admission, onward)         Start     Ordered   12/13/17 1438  Do not attempt resuscitation (DNR)  Continuous    Question Answer Comment  In the event of cardiac or respiratory ARREST Do not call a "code blue"   In the event of cardiac or respiratory ARREST Do not perform Intubation, CPR, defibrillation or ACLS   In the event of cardiac or respiratory ARREST Use medication by any route, position, wound care, and other measures to relive pain and suffering. May use oxygen, suction and manual treatment of airway obstruction as needed for comfort.      12/13/17 1441        Code Status History    Date Active Date Inactive Code Status Order ID Comments User Context   12/10/2017 1600 12/13/2017 1441 DNR [id number]  Angela Adam, RN Inpatient   12/07/2017 1519 12/10/2017 1600 Full Code [id number]  Salary, Avel Peace, MD ED      TOTAL TIME TAKING CARE OF THIS PATIENT ON DAY OF DISCHARGE: more than 35 minutes.   Saundra Shelling M.D on 12/15/2017 at 10:59 AM  Between 7am to 6pm - Pager - [phone]  After 6pm go to [email or web address] - password [email or web address] [facility]  Office  [phone]  CC: Primary care physician; Lavera Guise, MD  Note: This dictation was prepared with Dragon dictation along with smaller phrase technology. Any transcriptional errors that result from this process are unintentional.

## 2017-12-15 NOTE — Plan of Care (Signed)
  Problem: Pain Managment: Goal: General experience of comfort will improve Outcome: Progressing   Problem: Safety: Goal: Ability to remain free from injury will improve Outcome: Progressing   

## 2017-12-26 ENCOUNTER — Telehealth: Payer: Self-pay

## 2017-12-26 NOTE — Telephone Encounter (Signed)
12-22-17 PATIENTS DAUGHTER CALLED TO INFORM THAT PATIENT HAD PASSED AWAY TO INFORM PROVIDER AS WELL AS MARK DECEASED IN CHART. ADVISED NIMISHA TO DO SO BECAUSE I DO NOT HAVE ACCESS TO DO SO.

## 2018-01-16 DEATH — deceased
